# Patient Record
Sex: Female | Born: 1965 | Race: Black or African American | Hispanic: No | Smoking: Never smoker
Health system: Southern US, Community
[De-identification: ages and names within clinical notes are randomized; demographics above are authoritative.]

## PROBLEM LIST (undated history)

## (undated) DIAGNOSIS — Z87440 Personal history of urinary (tract) infections: Secondary | ICD-10-CM

## (undated) DIAGNOSIS — N3289 Other specified disorders of bladder: Secondary | ICD-10-CM

## (undated) DIAGNOSIS — B019 Varicella without complication: Secondary | ICD-10-CM

## (undated) DIAGNOSIS — D509 Iron deficiency anemia, unspecified: Secondary | ICD-10-CM

## (undated) HISTORY — DX: Personal history of urinary (tract) infections: Z87.440

## (undated) HISTORY — DX: Iron deficiency anemia, unspecified: D50.9

## (undated) HISTORY — DX: Varicella without complication: B01.9

---

## 1997-11-28 ENCOUNTER — Emergency Department (HOSPITAL_COMMUNITY): Admission: EM | Admit: 1997-11-28 | Discharge: 1997-11-28 | Payer: Self-pay | Admitting: Emergency Medicine

## 1999-02-14 ENCOUNTER — Other Ambulatory Visit: Admission: RE | Admit: 1999-02-14 | Discharge: 1999-02-14 | Payer: Self-pay | Admitting: Obstetrics and Gynecology

## 1999-06-19 ENCOUNTER — Inpatient Hospital Stay (HOSPITAL_COMMUNITY): Admission: AD | Admit: 1999-06-19 | Discharge: 1999-06-25 | Payer: Self-pay | Admitting: Obstetrics and Gynecology

## 1999-06-20 ENCOUNTER — Encounter: Payer: Self-pay | Admitting: Obstetrics and Gynecology

## 1999-06-22 ENCOUNTER — Encounter: Payer: Self-pay | Admitting: Obstetrics and Gynecology

## 1999-06-22 ENCOUNTER — Encounter: Payer: Self-pay | Admitting: *Deleted

## 1999-07-06 ENCOUNTER — Inpatient Hospital Stay (HOSPITAL_COMMUNITY): Admission: AD | Admit: 1999-07-06 | Discharge: 1999-07-08 | Payer: Self-pay | Admitting: Obstetrics and Gynecology

## 1999-07-10 ENCOUNTER — Encounter (HOSPITAL_COMMUNITY): Admission: RE | Admit: 1999-07-10 | Discharge: 1999-07-25 | Payer: Self-pay | Admitting: Obstetrics and Gynecology

## 1999-07-17 ENCOUNTER — Encounter: Payer: Self-pay | Admitting: Obstetrics and Gynecology

## 1999-07-24 ENCOUNTER — Inpatient Hospital Stay (HOSPITAL_COMMUNITY): Admission: AD | Admit: 1999-07-24 | Discharge: 1999-07-27 | Payer: Self-pay | Admitting: Obstetrics and Gynecology

## 1999-07-28 ENCOUNTER — Encounter: Admission: RE | Admit: 1999-07-28 | Discharge: 1999-10-26 | Payer: Self-pay | Admitting: Obstetrics and Gynecology

## 1999-08-30 ENCOUNTER — Other Ambulatory Visit: Admission: RE | Admit: 1999-08-30 | Discharge: 1999-08-30 | Payer: Self-pay | Admitting: Physical Therapy

## 1999-11-25 ENCOUNTER — Encounter: Admission: RE | Admit: 1999-11-25 | Discharge: 1999-12-08 | Payer: Self-pay | Admitting: Obstetrics and Gynecology

## 2001-08-15 ENCOUNTER — Other Ambulatory Visit: Admission: RE | Admit: 2001-08-15 | Discharge: 2001-08-15 | Payer: Self-pay | Admitting: Obstetrics and Gynecology

## 2003-02-15 ENCOUNTER — Other Ambulatory Visit: Admission: RE | Admit: 2003-02-15 | Discharge: 2003-02-15 | Payer: Self-pay | Admitting: Obstetrics and Gynecology

## 2004-07-07 ENCOUNTER — Other Ambulatory Visit: Admission: RE | Admit: 2004-07-07 | Discharge: 2004-07-07 | Payer: Self-pay | Admitting: Obstetrics and Gynecology

## 2005-10-09 ENCOUNTER — Other Ambulatory Visit: Admission: RE | Admit: 2005-10-09 | Discharge: 2005-10-09 | Payer: Self-pay | Admitting: Obstetrics and Gynecology

## 2014-09-11 ENCOUNTER — Emergency Department (HOSPITAL_COMMUNITY)
Admission: EM | Admit: 2014-09-11 | Discharge: 2014-09-11 | Disposition: A | Discharge status: Home / Self Care | Payer: 59 | Source: Home / Self Care | Attending: Family Medicine | Admitting: Family Medicine

## 2014-09-11 ENCOUNTER — Encounter (HOSPITAL_COMMUNITY): Payer: Self-pay | Admitting: *Deleted

## 2014-09-11 DIAGNOSIS — N3281 Overactive bladder: Secondary | ICD-10-CM | POA: Diagnosis not present

## 2014-09-11 LAB — POCT URINALYSIS DIP (DEVICE)
Bilirubin Urine: NEGATIVE
Glucose, UA: NEGATIVE mg/dL
Hgb urine dipstick: NEGATIVE
Ketones, ur: NEGATIVE mg/dL
Leukocytes, UA: NEGATIVE
Nitrite: NEGATIVE
Protein, ur: NEGATIVE mg/dL
Specific Gravity, Urine: 1.025 (ref 1.005–1.030)
Urobilinogen, UA: 0.2 mg/dL (ref 0.0–1.0)
pH: 5.5 (ref 5.0–8.0)

## 2014-09-11 MED ORDER — SOLIFENACIN SUCCINATE 10 MG PO TABS: 10.0000 mg | ORAL_TABLET | Freq: Every day | ORAL | Status: DC

## 2014-09-11 NOTE — ED Provider Notes (Signed)
CSN: [id number]     Arrival date & time 09/11/14  1113 History   First MD Initiated Contact with Patient 09/11/14 1156     Chief Complaint  Patient presents with  . Urinary Tract Infection   (Consider location/radiation/quality/duration/timing/severity/associated sxs/prior Treatment) Patient is a 49 y.o. female presenting with urinary tract infection. The history is provided by the patient.  Urinary Tract Infection This is a new problem. The current episode started more than 2 days ago. The problem has not changed since onset.Pertinent negatives include no abdominal pain and no shortness of breath. Associated symptoms comments: Mod caffeine use, perimenopausal..    History reviewed. No pertinent past medical history. History reviewed. No pertinent past surgical history. History reviewed. No pertinent family history. History  Substance Use Topics  . Smoking status: Never Smoker   . Smokeless tobacco: Not on file  . Alcohol Use: Yes   OB History    No data available     Review of Systems  Constitutional: Negative.  Negative for fever and chills.  Respiratory: Negative for shortness of breath.   Gastrointestinal: Negative.  Negative for abdominal pain.  Genitourinary: Positive for urgency and frequency. Negative for dysuria, hematuria, flank pain, vaginal bleeding, difficulty urinating, menstrual problem and pelvic pain.    Allergies  Review of patient's allergies indicates no known allergies.  Home Medications   Prior to Admission medications   Medication Sig Start Date End Date Taking? Authorizing Provider  solifenacin (VESICARE) 10 MG tablet Take 1 tablet (10 mg total) by mouth daily. 09/11/14   Linna HoffJames D Velta Rockholt, MD   BP 130/98 mmHg  Pulse 76  Temp(Src) 99 F (37.2 C) (Oral)  Resp 12  SpO2 100%  LMP 08/19/2014 Physical Exam  Constitutional: She is oriented to person, place, and time. She appears well-developed and well-nourished. No distress.  Abdominal: Soft. Bowel  sounds are normal. She exhibits no mass. There is no tenderness.  Neurological: She is alert and oriented to person, place, and time.  Skin: Skin is warm and dry.  Nursing note and vitals reviewed.   ED Course  Procedures (including critical care time) Labs Review Labs Reviewed  POCT URINALYSIS DIP (DEVICE)    Imaging Review No results found.   MDM   1. OAB (overactive bladder)        Linna HoffJames D Manish Ruggiero, MD 09/11/14 1219

## 2014-09-11 NOTE — ED Notes (Signed)
Pt  Reports  Symptoms  Of urinary  Frequency       Low  abd  Pressure  Pain     With  Onset  X  4-5  Days             Sitting  Upright on the  Exam table  Speaking in  Complete  sentances       Walking  Upright with a  Steady  Fluid  Gait

## 2015-03-23 ENCOUNTER — Emergency Department (HOSPITAL_COMMUNITY)
Admission: EM | Admit: 2015-03-23 | Discharge: 2015-03-23 | Disposition: A | Discharge status: Home / Self Care | Payer: 59 | Source: Home / Self Care | Attending: Family Medicine | Admitting: Family Medicine

## 2015-03-23 ENCOUNTER — Encounter (HOSPITAL_COMMUNITY): Payer: Self-pay | Admitting: Family Medicine

## 2015-03-23 DIAGNOSIS — J069 Acute upper respiratory infection, unspecified: Secondary | ICD-10-CM | POA: Diagnosis not present

## 2015-03-23 DIAGNOSIS — B9789 Other viral agents as the cause of diseases classified elsewhere: Principal | ICD-10-CM

## 2015-03-23 HISTORY — DX: Other specified disorders of bladder: N32.89

## 2015-03-23 MED ORDER — IPRATROPIUM BROMIDE 0.06 % NA SOLN: 2.0000 | Freq: Four times a day (QID) | NASAL | Status: DC

## 2015-03-23 MED ORDER — GUAIFENESIN-CODEINE 100-10 MG/5ML PO SOLN: 5.0000 mL | Freq: Four times a day (QID) | ORAL | Status: DC | PRN

## 2015-03-23 NOTE — ED Provider Notes (Signed)
CSN: [id number]     Arrival date & time 03/23/15  1338 History   First MD Initiated Contact with Patient 03/23/15 1600     Chief Complaint  Patient presents with  . URI   (Consider location/radiation/quality/duration/timing/severity/associated sxs/prior Treatment) HPI   5 days of cough, runny nose, and chest congestion. theraflu w/ minimal improvement. Worse at night.  No fevers, CP, nausea, diarrhea, productive cough, wheezing, abdominal pain, except, headache, dysuria, frequency, sore throat Son w/ similar symptoms prior to pts symptoms.    Past Medical History  Diagnosis Date  . Bladder spasm    History reviewed. No pertinent past surgical history. Family History  Problem Relation Age of Onset  . Cancer Other   . Diabetes Other    Social History  Substance Use Topics  . Smoking status: Never Smoker   . Smokeless tobacco: None  . Alcohol Use: Yes   OB History    No data available     Review of Systems Per HPI with all other pertinent systems negative.   Allergies  Review of patient's allergies indicates no known allergies.  Home Medications   Prior to Admission medications   Medication Sig Start Date End Date Taking? Authorizing Provider  guaiFENesin-codeine 100-10 MG/5ML syrup Take 5-10 mLs by mouth every 6 (six) hours as needed for cough. 03/23/15   Ozella Rocks, MD  ipratropium (ATROVENT) 0.06 % nasal spray Place 2 sprays into both nostrils 4 (four) times daily. 03/23/15   Ozella Rocks, MD   Meds Ordered and Administered this Visit  Medications - No data to display  BP 150/87 mmHg  Pulse 70  Temp(Src) 99.3 F (37.4 C) (Oral)  Resp 16  SpO2 100% No data found.   Physical Exam Physical Exam  Constitutional: oriented to person, place, and time. appears well-developed and well-nourished. No distress.  HENT:  No cervical lymphadenopathy, normal maxillary sinus tenderness to palpation, TMs normal bilaterally Head: Normocephalic and atraumatic.   Eyes: EOMI. PERRL.  Neck: Normal range of motion.  Cardiovascular: RRR, no m/r/g, 2+ distal pulses,  Pulmonary/Chest: Effort normal and breath sounds normal. No respiratory distress.  Abdominal: Soft. Bowel sounds are normal. NonTTP, no distension.  Musculoskeletal: Normal range of motion. Non ttp, no effusion.  Neurological: alert and oriented to person, place, and time.  Skin: Skin is warm. No rash noted. non diaphoretic.  Psychiatric: normal mood and affect. behavior is normal. Judgment and thought content normal.    ED Course  Procedures (including critical care time)  Labs Review Labs Reviewed - No data to display  Imaging Review No results found.   Visual Acuity Review  Right Eye Distance:   Left Eye Distance:   Bilateral Distance:    Right Eye Near:   Left Eye Near:    Bilateral Near:         MDM   1. Viral URI with cough    Common cold. Nasal Atrovent, Robitussin-AC, fluids, rest, NSAIDs, intranasal steroid.  Ozella Rocks, MD 03/23/15 [phone]

## 2015-03-23 NOTE — ED Notes (Signed)
Pt  Reports  Symptoms  Of  Cough   Congested  X  5 days

## 2015-03-23 NOTE — Discharge Instructions (Signed)
You likely have a viral cold. This should start getting better in the next 1-3 days. Please start using the nasal Atrovent during the day to help with your runny nose and postnasal drip. Please use the Robitussin-AC at night for cough. Please also consider using Flonase or Rhinocort at night to help with your sinus congestion. Please also consider starting Motrin/ibuprofen 600 mg every 6 hours for additional pain and inflammation. Please let us know if you develop fevers or worsening facial pain as this may be a sign that you're developing sinusitis.

## 2015-11-17 LAB — HM COLONOSCOPY

## 2016-01-10 ENCOUNTER — Other Ambulatory Visit: Payer: Self-pay | Admitting: Obstetrics and Gynecology

## 2016-01-10 DIAGNOSIS — R928 Other abnormal and inconclusive findings on diagnostic imaging of breast: Secondary | ICD-10-CM

## 2016-01-13 ENCOUNTER — Ambulatory Visit
Admission: RE | Admit: 2016-01-13 | Discharge: 2016-01-13 | Disposition: A | Discharge status: Home / Self Care | Payer: 59 | Source: Ambulatory Visit | Attending: Obstetrics and Gynecology | Admitting: Obstetrics and Gynecology

## 2016-01-13 ENCOUNTER — Other Ambulatory Visit: Payer: Self-pay | Admitting: Obstetrics and Gynecology

## 2016-01-13 DIAGNOSIS — R928 Other abnormal and inconclusive findings on diagnostic imaging of breast: Secondary | ICD-10-CM

## 2016-02-01 LAB — HM PAP SMEAR: HM Pap smear: NORMAL

## 2016-07-30 ENCOUNTER — Other Ambulatory Visit: Payer: Self-pay | Admitting: Obstetrics and Gynecology

## 2016-07-30 DIAGNOSIS — N6310 Unspecified lump in the right breast, unspecified quadrant: Secondary | ICD-10-CM

## 2016-08-08 ENCOUNTER — Ambulatory Visit
Admission: RE | Admit: 2016-08-08 | Discharge: 2016-08-08 | Disposition: A | Discharge status: Home / Self Care | Payer: 59 | Source: Ambulatory Visit | Attending: Obstetrics and Gynecology | Admitting: Obstetrics and Gynecology

## 2016-08-08 DIAGNOSIS — N6310 Unspecified lump in the right breast, unspecified quadrant: Secondary | ICD-10-CM

## 2016-08-08 DIAGNOSIS — N6313 Unspecified lump in the right breast, lower outer quadrant: Secondary | ICD-10-CM | POA: Diagnosis not present

## 2016-12-03 ENCOUNTER — Ambulatory Visit (INDEPENDENT_AMBULATORY_CARE_PROVIDER_SITE_OTHER): Payer: 59 | Admitting: Primary Care

## 2016-12-03 ENCOUNTER — Encounter: Payer: Self-pay | Admitting: Primary Care

## 2016-12-03 VITALS — BP 154/86 | HR 74 | Temp 98.0°F | Ht 64.0 in | Wt 155.8 lb

## 2016-12-03 DIAGNOSIS — R030 Elevated blood-pressure reading, without diagnosis of hypertension: Secondary | ICD-10-CM | POA: Insufficient documentation

## 2016-12-03 DIAGNOSIS — Z862 Personal history of diseases of the blood and blood-forming organs and certain disorders involving the immune mechanism: Secondary | ICD-10-CM

## 2016-12-03 DIAGNOSIS — R5383 Other fatigue: Secondary | ICD-10-CM | POA: Diagnosis not present

## 2016-12-03 NOTE — Patient Instructions (Signed)
Complete lab work prior to leaving today. I will notify you of your results once received.   It was a pleasure to meet you today! Please don't hesitate to call me with any questions. Welcome to [facility]!   

## 2016-12-03 NOTE — Assessment & Plan Note (Addendum)
History of iron deficiency anemia. Also with elevated BP readings on numerous occasions. All of these scenarios could be contributing. Will check labs today to rule out metabolic cause. No signs of depression.

## 2016-12-03 NOTE — Assessment & Plan Note (Signed)
Above goal today, also several documented readings in Epic. Will have her start monitoring her BP at home, will call for readings in 2-3 weeks. She is asymptomatic.

## 2016-12-03 NOTE — Progress Notes (Signed)
   Subjective:    Patient ID: Angela Harmon, female    DOB: [birth year], 51 y.o.   MRN: [id number]  HPI  Angela Harmon is a 51 year old female who presents today to establish care and discuss the problems mentioned below. Will obtain old records.  1) Fatigue: History of iron deficiency anemia in the past. Symptoms of feeling sluggish, low energy. These symptoms have been present intermittent for years. She's not currently taking oral iron, but doesn't recall an improvement when taking the iron. She was never consistent in taking her iron. She denies family history hypothyroidism.   2) Elevated Blood Pressure Reading: BP in the office today of 160/90. Prior documented readings in Epic of 150/87 in October 2016, and 130/98 in March 2016. Repeat BP in the office today of 154/86. She's never been told she's had high blood pressure in the past. She does not check her BP at home. She denies dizziness, chest pain, visual changes.  Review of Systems  Constitutional: Positive for fatigue. Negative for fever.  Respiratory: Negative for shortness of breath.   Cardiovascular: Negative for palpitations.  Gastrointestinal: Negative for abdominal pain and nausea.  Endocrine: Positive for cold intolerance.  Genitourinary: Negative for menstrual problem.  Neurological: Negative for weakness.       Past Medical History:  Diagnosis Date  . Bladder spasm   . Chickenpox   . History of UTI   . Iron deficiency anemia      Social History   Social History  . Marital status: Divorced    Spouse name: N/A  . Number of children: N/A  . Years of education: N/A   Occupational History  . Not on file.   Social History Main Topics  . Smoking status: Never Smoker  . Smokeless tobacco: Never Used  . Alcohol use Yes  . Drug use: Unknown  . Sexual activity: Not on file   Other Topics Concern  . Not on file   Social History Narrative   Divorced.   2 children.   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   Enjoys reading, going to the movies, travel, riding bikes.    Past Surgical History:  Procedure Laterality Date  . CESAREAN SECTION  2001    Family History  Problem Relation Age of Onset  . Hypertension Father   . Alcohol abuse Father   . Lymphoma Maternal Grandmother   . Diabetes Maternal Grandfather   . Rectal cancer Maternal Aunt     No Known Allergies  No current outpatient prescriptions on file prior to visit.   No current facility-administered medications on file prior to visit.     BP (!) 154/86   Pulse 74   Temp 98 F (36.7 C) (Oral)   Ht 5\' 4"  (1.626 m)   Wt 155 lb 12.8 oz (70.7 kg)   LMP 11/29/2016   SpO2 99%   BMI 26.74 kg/m    Objective:   Physical Exam  Constitutional: She appears well-nourished.  Neck: Neck supple.  Cardiovascular: Normal rate and regular rhythm.   Pulmonary/Chest: Effort normal and breath sounds normal.  Skin: Skin is warm and dry.  Psychiatric: She has a normal mood and affect.          Assessment & Plan:

## 2016-12-04 LAB — COMPREHENSIVE METABOLIC PANEL
ALBUMIN: 3.9 g/dL (ref 3.5–5.2)
ALK PHOS: 74 U/L (ref 39–117)
ALT: 11 U/L (ref 0–35)
AST: 15 U/L (ref 0–37)
BILIRUBIN TOTAL: 0.4 mg/dL (ref 0.2–1.2)
BUN: 9 mg/dL (ref 6–23)
CALCIUM: 9.1 mg/dL (ref 8.4–10.5)
CO2: 28 meq/L (ref 19–32)
CREATININE: 0.63 mg/dL (ref 0.40–1.20)
Chloride: 104 mEq/L (ref 96–112)
GFR: 126.99 mL/min (ref >60.00)
Glucose, Bld: 91 mg/dL (ref 70–99)
Potassium: 3.8 mEq/L (ref 3.5–5.1)
Sodium: 140 mEq/L (ref 135–145)
Total Protein: 6.8 g/dL (ref 6.0–8.3)

## 2016-12-04 LAB — VITAMIN D 25 HYDROXY (VIT D DEFICIENCY, FRACTURES): VITD: 14.89 ng/mL — ABNORMAL LOW (ref 30.00–100.00)

## 2016-12-04 LAB — IBC PANEL
IRON: 13 ug/dL — AB (ref 42–145)
SATURATION RATIOS: 3 % — AB (ref 20.0–50.0)
TRANSFERRIN: 309 mg/dL (ref 212.0–360.0)

## 2016-12-04 LAB — CBC
HCT: 27.5 % — ABNORMAL LOW (ref 36.0–46.0)
Hemoglobin: 9.1 g/dL — ABNORMAL LOW (ref 12.0–15.0)
MCHC: 33.1 g/dL (ref 30.0–36.0)
MCV: 68.9 fl — ABNORMAL LOW (ref 78.0–100.0)
Platelets: 493 10*3/uL — ABNORMAL HIGH (ref 150.0–400.0)
RBC: 3.99 Mil/uL (ref 3.87–5.11)
RDW: 20.1 % — ABNORMAL HIGH (ref 11.5–15.5)
WBC: 8.1 10*3/uL (ref 4.0–10.5)

## 2016-12-04 LAB — VITAMIN B12: VITAMIN B 12: 533 pg/mL (ref 211–911)

## 2016-12-04 LAB — TSH: TSH: 1.61 u[IU]/mL (ref 0.35–4.50)

## 2016-12-24 ENCOUNTER — Telehealth: Payer: Self-pay | Admitting: Primary Care

## 2016-12-24 NOTE — Telephone Encounter (Signed)
-----   Message from Doreene NestKatherine K Cliffie Gingras, NP sent at 12/03/2016  4:01 PM EDT ----- Regarding: BP Please check on patient's home BP readings. Her BP was elevated in the clinic during her new patient visit, also on prior Epic visits.

## 2016-12-24 NOTE — Telephone Encounter (Signed)
Message left for patient to return my call.  

## 2016-12-25 NOTE — Telephone Encounter (Signed)
Spoken to patient and she stated that her blood pressure is much better now. The last time she checked it was Sunday and it was 125/73.

## 2016-12-25 NOTE — Telephone Encounter (Signed)
Noted  

## 2017-01-14 ENCOUNTER — Other Ambulatory Visit: Payer: 59

## 2017-01-29 ENCOUNTER — Other Ambulatory Visit: Payer: Self-pay | Admitting: Obstetrics and Gynecology

## 2017-01-29 DIAGNOSIS — N63 Unspecified lump in unspecified breast: Secondary | ICD-10-CM

## 2017-02-11 ENCOUNTER — Ambulatory Visit
Admission: RE | Admit: 2017-02-11 | Discharge: 2017-02-11 | Disposition: A | Discharge status: Home / Self Care | Payer: 59 | Source: Ambulatory Visit | Attending: Obstetrics and Gynecology | Admitting: Obstetrics and Gynecology

## 2017-02-11 DIAGNOSIS — N6310 Unspecified lump in the right breast, unspecified quadrant: Secondary | ICD-10-CM | POA: Diagnosis not present

## 2017-02-11 DIAGNOSIS — R922 Inconclusive mammogram: Secondary | ICD-10-CM | POA: Diagnosis not present

## 2017-02-11 DIAGNOSIS — N63 Unspecified lump in unspecified breast: Secondary | ICD-10-CM

## 2017-04-11 DIAGNOSIS — Z01419 Encounter for gynecological examination (general) (routine) without abnormal findings: Secondary | ICD-10-CM | POA: Diagnosis not present

## 2018-01-08 IMAGING — MG 2D DIGITAL DIAGNOSTIC UNILATERAL RIGHT MAMMOGRAM WITH CAD AND AD
6 series · 6 of 14 positions shown · non-contrast
Comparison: Previous exam(s).

CLINICAL DATA: Six-month follow-up of a right breast mass.

EXAM:
2D DIGITAL DIAGNOSTIC RIGHT MAMMOGRAM WITH CAD AND ADJUNCT TOMO
ULTRASOUND RIGHT BREAST

[R MLO]
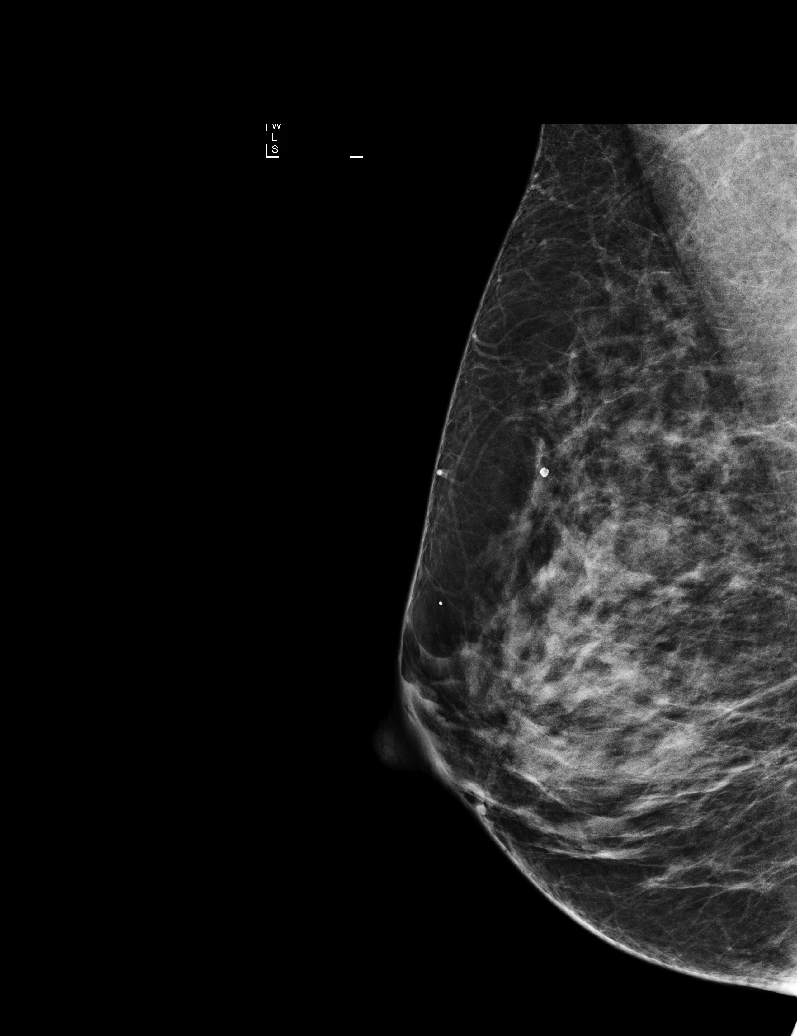

[R CC synth-2D]
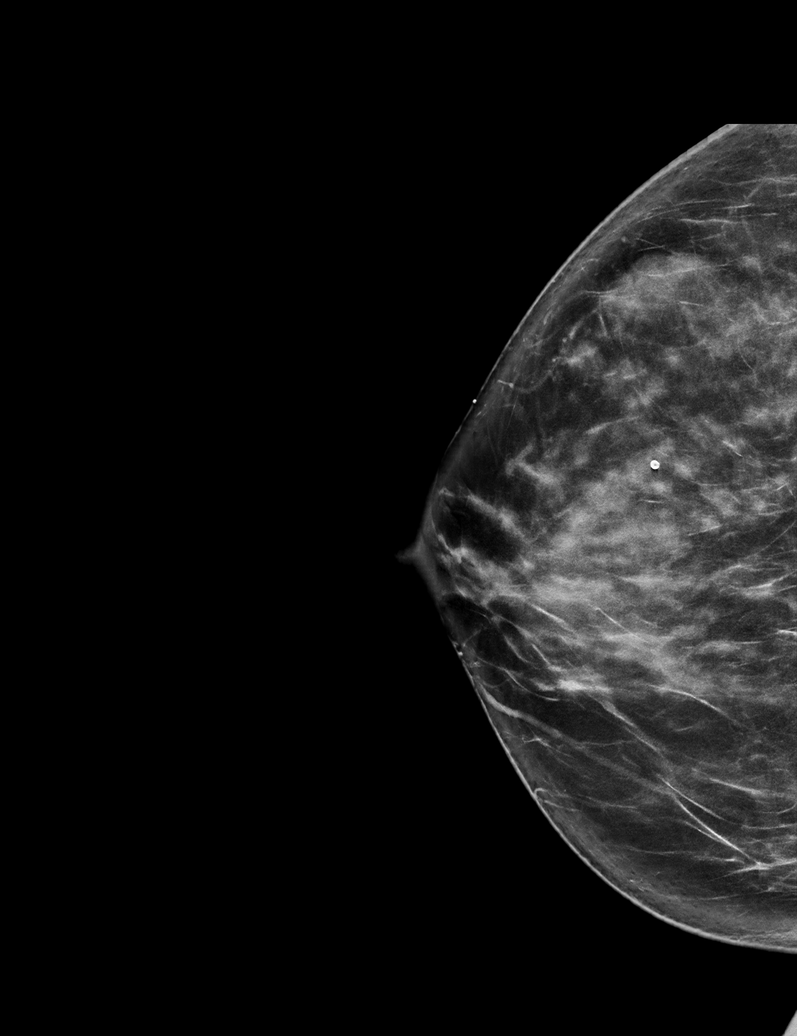

[R MLO synth-2D]
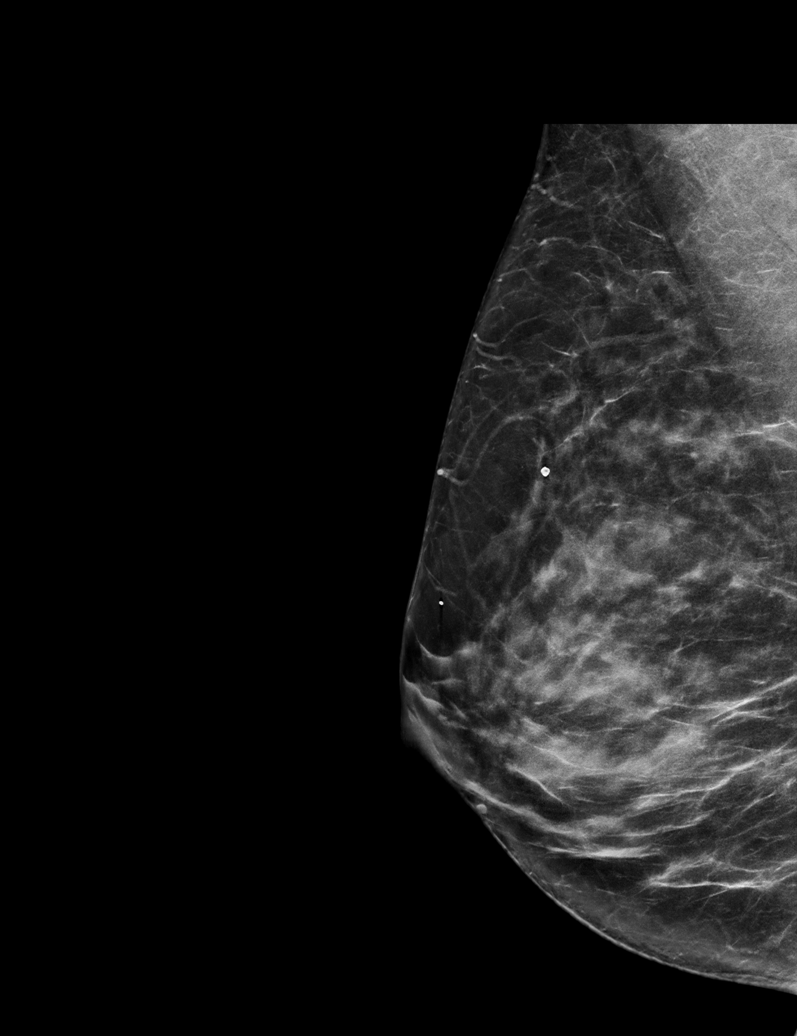

[R CC]
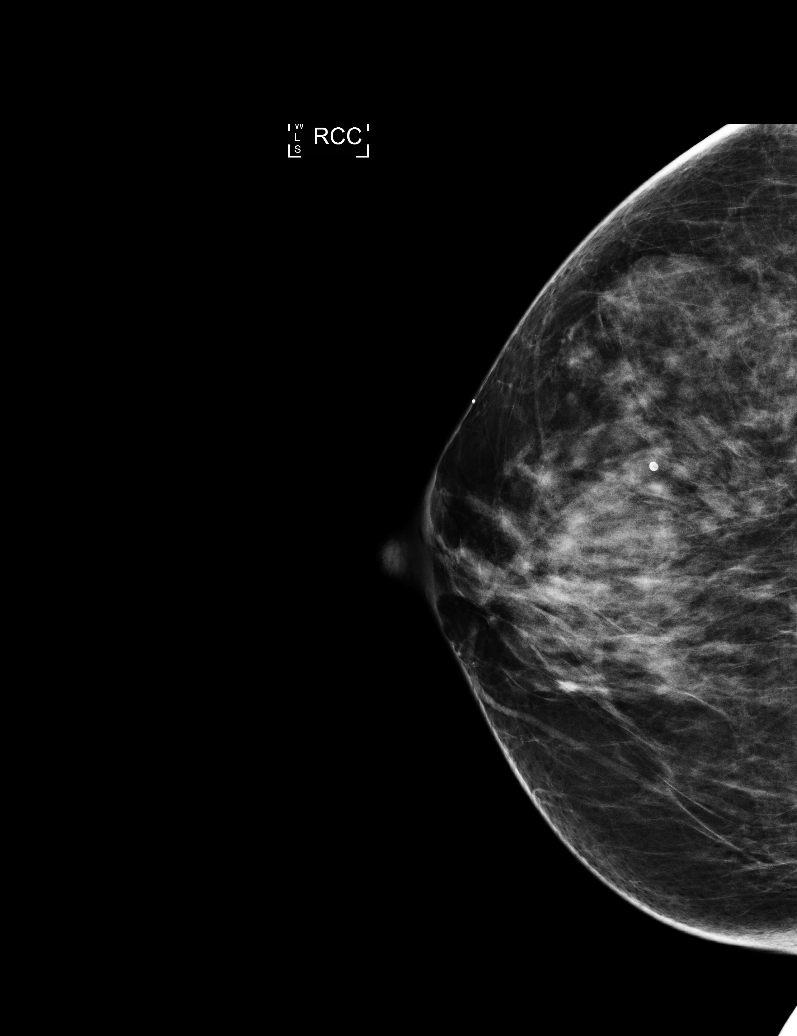

[R MLO tomo · tomo slice 36/71.0]
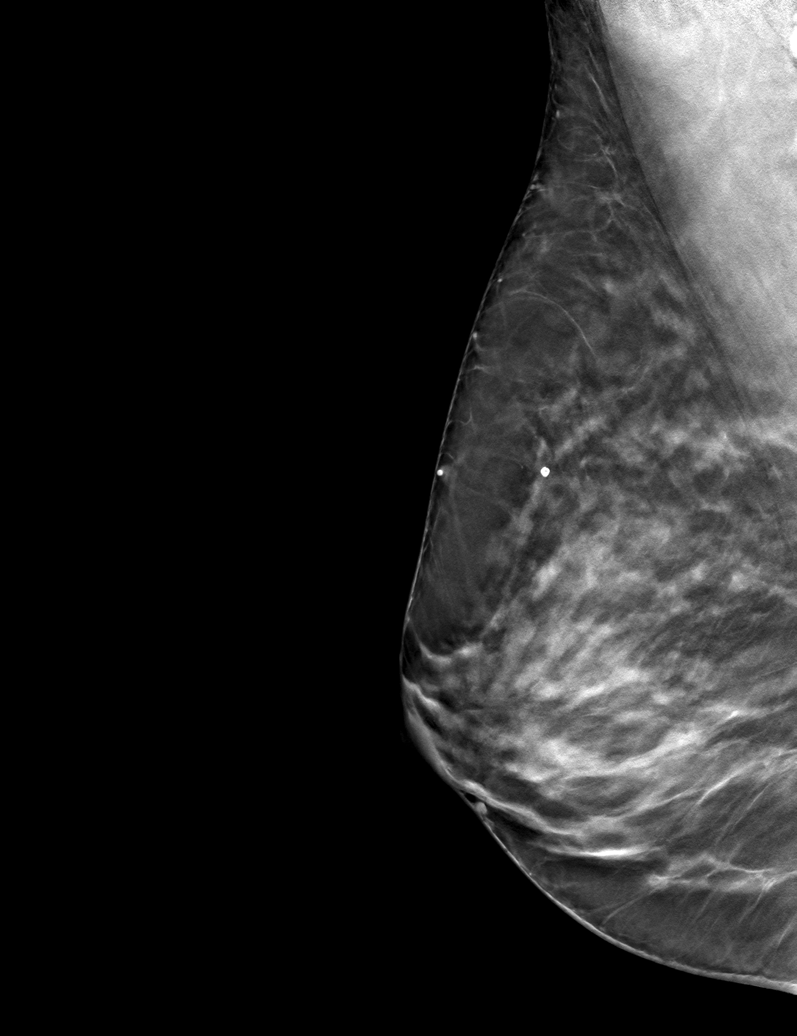

[R CC tomo · tomo slice 36/71.0]
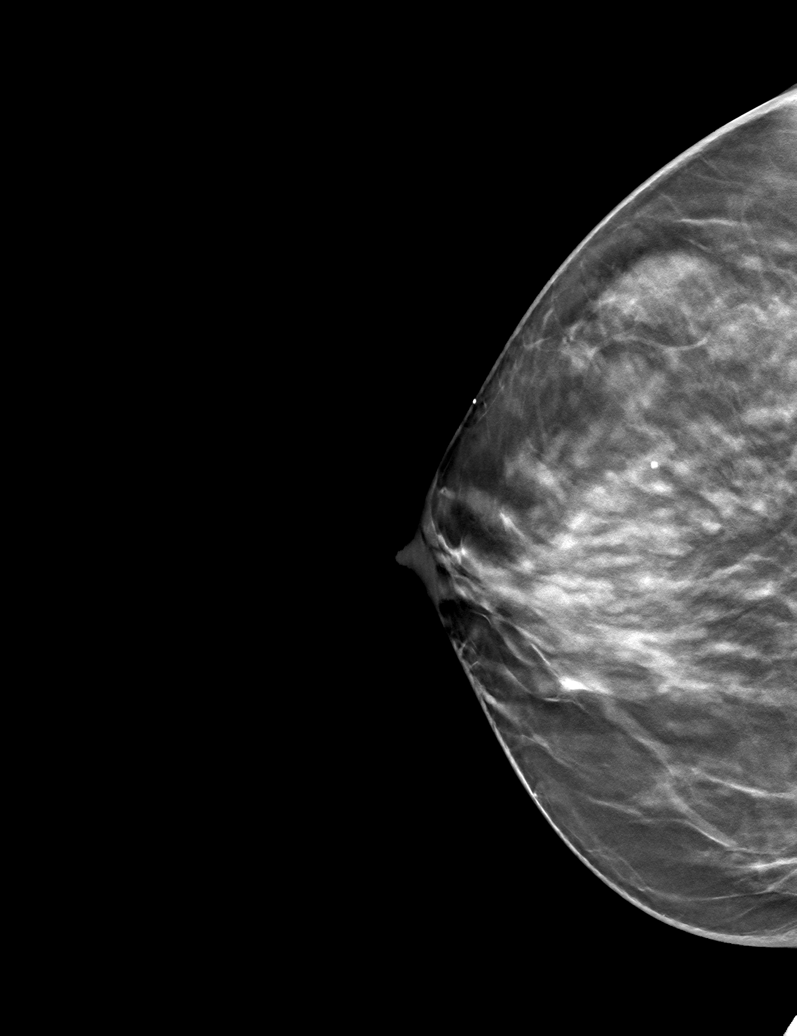

[6 of 14 positions shown; findings below may reference images not displayed]

ACR Breast Density Category c: The breast tissue is heterogeneously
dense, which may obscure small masses.
FINDINGS: The mass in the lateral central right breast is stable.

Mammographic images were processed with CAD.

On physical exam, no suspicious lumps.

Targeted ultrasound is performed, showing a heterogeneous mass in
the right breast at [DATE], 4 cm from the nipple measuring 2 by 2.4 x
1.4 cm today versus 1.9 by 2.4 by 1.4 cm previously.
IMPRESSION: Stable probably benign right breast mass

RECOMMENDATION:
Six-month follow-up mammogram and ultrasound of the right breast
mass to ensure stability. The patient will be due for bilateral
mammography at that time.

I have discussed the findings and recommendations with the patient.
Results were also provided in writing at the conclusion of the
visit. If applicable, a reminder letter will be sent to the patient
regarding the next appointment.

BI-RADS CATEGORY  3: Probably benign.

## 2018-05-29 DIAGNOSIS — Z01419 Encounter for gynecological examination (general) (routine) without abnormal findings: Secondary | ICD-10-CM | POA: Diagnosis not present

## 2018-05-29 DIAGNOSIS — Z6827 Body mass index (BMI) 27.0-27.9, adult: Secondary | ICD-10-CM | POA: Diagnosis not present

## 2018-12-25 ENCOUNTER — Other Ambulatory Visit: Payer: Self-pay | Admitting: Primary Care

## 2018-12-25 DIAGNOSIS — Z1159 Encounter for screening for other viral diseases: Secondary | ICD-10-CM

## 2018-12-25 DIAGNOSIS — Z0000 Encounter for general adult medical examination without abnormal findings: Secondary | ICD-10-CM

## 2018-12-25 DIAGNOSIS — E559 Vitamin D deficiency, unspecified: Secondary | ICD-10-CM

## 2018-12-31 ENCOUNTER — Other Ambulatory Visit (INDEPENDENT_AMBULATORY_CARE_PROVIDER_SITE_OTHER): Payer: 59

## 2018-12-31 DIAGNOSIS — Z0000 Encounter for general adult medical examination without abnormal findings: Secondary | ICD-10-CM | POA: Diagnosis not present

## 2018-12-31 DIAGNOSIS — E559 Vitamin D deficiency, unspecified: Secondary | ICD-10-CM | POA: Diagnosis not present

## 2018-12-31 DIAGNOSIS — Z1159 Encounter for screening for other viral diseases: Secondary | ICD-10-CM

## 2018-12-31 LAB — COMPREHENSIVE METABOLIC PANEL
ALT: 9 U/L (ref 0–35)
AST: 13 U/L (ref 0–37)
Albumin: 3.8 g/dL (ref 3.5–5.2)
Alkaline Phosphatase: 74 U/L (ref 39–117)
BUN: 9 mg/dL (ref 6–23)
CO2: 27 mEq/L (ref 19–32)
Calcium: 8.5 mg/dL (ref 8.4–10.5)
Chloride: 105 mEq/L (ref 96–112)
Creatinine, Ser: 0.58 mg/dL (ref 0.40–1.20)
GFR: 130.43 mL/min (ref >60.00)
Glucose, Bld: 92 mg/dL (ref 70–99)
Potassium: 4.1 mEq/L (ref 3.5–5.1)
Sodium: 138 mEq/L (ref 135–145)
Total Bilirubin: 0.4 mg/dL (ref 0.2–1.2)
Total Protein: 6.5 g/dL (ref 6.0–8.3)

## 2018-12-31 LAB — LIPID PANEL
Cholesterol: 178 mg/dL (ref 0–200)
HDL: 60.8 mg/dL (ref >39.00)
LDL Cholesterol: 104 mg/dL — ABNORMAL HIGH (ref 0–99)
NonHDL: 117.22
Total CHOL/HDL Ratio: 3
Triglycerides: 67 mg/dL (ref 0.0–149.0)
VLDL: 13.4 mg/dL (ref 0.0–40.0)

## 2018-12-31 LAB — HEMOGLOBIN A1C: Hgb A1c MFr Bld: 5 % (ref 4.6–6.5)

## 2018-12-31 LAB — VITAMIN D 25 HYDROXY (VIT D DEFICIENCY, FRACTURES): VITD: 23.62 ng/mL — ABNORMAL LOW (ref 30.00–100.00)

## 2019-01-01 LAB — HEPATITIS C ANTIBODY
Hepatitis C Ab: NONREACTIVE
SIGNAL TO CUT-OFF: 0.01 (ref <1.00)

## 2019-01-06 ENCOUNTER — Encounter: Payer: 59 | Admitting: Primary Care

## 2019-01-14 ENCOUNTER — Ambulatory Visit (INDEPENDENT_AMBULATORY_CARE_PROVIDER_SITE_OTHER): Payer: 59 | Admitting: Primary Care

## 2019-01-14 ENCOUNTER — Other Ambulatory Visit: Payer: Self-pay

## 2019-01-14 ENCOUNTER — Encounter: Payer: Self-pay | Admitting: Primary Care

## 2019-01-14 VITALS — BP 126/84 | HR 72 | Temp 98.0°F | Ht 64.0 in | Wt 166.0 lb

## 2019-01-14 DIAGNOSIS — D509 Iron deficiency anemia, unspecified: Secondary | ICD-10-CM

## 2019-01-14 DIAGNOSIS — R030 Elevated blood-pressure reading, without diagnosis of hypertension: Secondary | ICD-10-CM | POA: Diagnosis not present

## 2019-01-14 DIAGNOSIS — Z23 Encounter for immunization: Secondary | ICD-10-CM | POA: Diagnosis not present

## 2019-01-14 DIAGNOSIS — Z0000 Encounter for general adult medical examination without abnormal findings: Secondary | ICD-10-CM

## 2019-01-14 DIAGNOSIS — E559 Vitamin D deficiency, unspecified: Secondary | ICD-10-CM

## 2019-01-14 DIAGNOSIS — R5383 Other fatigue: Secondary | ICD-10-CM

## 2019-01-14 HISTORY — DX: Iron deficiency anemia, unspecified: D50.9

## 2019-01-14 LAB — CBC
HCT: 36.8 % (ref 36.0–46.0)
Hemoglobin: 11.8 g/dL — ABNORMAL LOW (ref 12.0–15.0)
MCHC: 32.2 g/dL (ref 30.0–36.0)
MCV: 80.8 fl (ref 78.0–100.0)
Platelets: 386 10*3/uL (ref 150.0–400.0)
RBC: 4.55 Mil/uL (ref 3.87–5.11)
RDW: 18 % — ABNORMAL HIGH (ref 11.5–15.5)
WBC: 6.2 10*3/uL (ref 4.0–10.5)

## 2019-01-14 LAB — IBC + FERRITIN
Ferritin: 5.3 ng/mL — ABNORMAL LOW (ref 10.0–291.0)
Iron: 59 ug/dL (ref 42–145)
Saturation Ratios: 14.2 % — ABNORMAL LOW (ref 20.0–50.0)
Transferrin: 297 mg/dL (ref 212.0–360.0)

## 2019-01-14 NOTE — Progress Notes (Signed)
Subjective:    Patient ID: Angela Harmon, female    DOB: 03/23/1966, 53 y.o.   MRN: [id number]  HPI  Angela Harmon is a 53 year old female who presents today for complete physical.  Immunizations: -Tetanus: Unsure -Influenza: Due this season   Diet: She endorses a fair diet. She is eating smaller portions, skips meals. Eating take out for lunch most days of the week, home cooked meals. Drinking water, lemonade, sweet tea. Exercise: She is walking some  Eye exam: Completed in 2019 Dental exam: Completes semi-annually  Colonoscopy: Completed in 2017, due in 2027 Pap Smear: Completed in 2019 Mammogram: Completed in 2019 Hep C Screen: Negative in 2020  BP Readings from Last 3 Encounters:  01/14/19 126/84  12/03/16 (!) 154/86  03/23/15 150/87     Review of Systems  Constitutional: Negative for unexpected weight change.  HENT: Negative for rhinorrhea.   Respiratory: Negative for cough and shortness of breath.   Cardiovascular: Negative for chest pain.  Gastrointestinal: Negative for constipation and diarrhea.  Genitourinary: Negative for difficulty urinating.       Irregular menstrual cycles over the last several months  Musculoskeletal: Negative for arthralgias and myalgias.  Skin: Negative for rash.  Allergic/Immunologic: Negative for environmental allergies.  Neurological: Negative for dizziness, numbness and headaches.  Psychiatric/Behavioral: The patient is not nervous/anxious.        Past Medical History:  Diagnosis Date  . Bladder spasm   . Chickenpox   . History of UTI   . Iron deficiency anemia      Social History   Socioeconomic History  . Marital status: Divorced    Spouse name: Not on file  . Number of children: Not on file  . Years of education: Not on file  . Highest education level: Not on file  Occupational History  . Not on file  Social Needs  . Financial resource strain: Not on file  . Food insecurity    Worry: Not on file    Inability: Not  on file  . Transportation needs    Medical: Not on file    Non-medical: Not on file  Tobacco Use  . Smoking status: Never Smoker  . Smokeless tobacco: Never Used  Substance and Sexual Activity  . Alcohol use: Yes  . Drug use: Not on file  . Sexual activity: Not on file  Lifestyle  . Physical activity    Days per week: Not on file    Minutes per session: Not on file  . Stress: Not on file  Relationships  . Social Herbalist on phone: Not on file    Gets together: Not on file    Attends religious service: Not on file    Active member of club or organization: Not on file    Attends meetings of clubs or organizations: Not on file    Relationship status: Not on file  . Intimate partner violence    Fear of current or ex partner: Not on file    Emotionally abused: Not on file    Physically abused: Not on file    Forced sexual activity: Not on file  Other Topics Concern  . Not on file  Social History Narrative   Divorced.   2 children.   Works as a Librarian, academic as a Education officer, museum.   Enjoys reading, going to the movies, travel, riding bikes.    Past Surgical History:  Procedure Laterality Date  . CESAREAN SECTION  2001  Family History  Problem Relation Age of Onset  . Hypertension Father   . Alcohol abuse Father   . Lymphoma Maternal Grandmother   . Diabetes Maternal Grandfather   . Rectal cancer Maternal Aunt     No Known Allergies  No current outpatient medications on file prior to visit.   No current facility-administered medications on file prior to visit.     BP 126/84   Pulse 72   Temp 98 F (36.7 C) (Temporal)   Ht 5\' 4"  (1.626 m)   Wt 166 lb (75.3 kg)   LMP 11/27/2018 (Within Days)   SpO2 98%   BMI 28.49 kg/m    Objective:   Physical Exam  Constitutional: She is oriented to person, place, and time. She appears well-nourished.  HENT:  Mouth/Throat: No oropharyngeal exudate.  Eyes: Pupils are equal, round, and reactive to light. EOM  are normal.  Neck: Neck supple. No thyromegaly present.  Cardiovascular: Normal rate and regular rhythm.  Respiratory: Effort normal and breath sounds normal.  GI: Soft. Bowel sounds are normal. There is no abdominal tenderness.  Musculoskeletal: Normal range of motion.  Neurological: She is alert and oriented to person, place, and time.  Skin: Skin is warm and dry.  Psychiatric: She has a normal mood and affect.           Assessment & Plan:

## 2019-01-14 NOTE — Assessment & Plan Note (Signed)
Low on recent labs and not taking supplemental vitamin D, recommend she start 2000 units daily.

## 2019-01-14 NOTE — Assessment & Plan Note (Addendum)
Unsure for tetanus, provided today.  Pap smear UTD, follows with GYN. Colonoscopy UTD. Encouraged a healthy diet, regular exercise. Exam unremarkable. Labs reviewed. Follow up in 1 year for CPE

## 2019-01-14 NOTE — Addendum Note (Signed)
Addended by: Jacqualin Combes on: 01/14/2019 04:02 PM   Modules accepted: Orders

## 2019-01-14 NOTE — Assessment & Plan Note (Signed)
Normal in the office today, continue to monitor.  

## 2019-01-14 NOTE — Patient Instructions (Addendum)
Stop by the lab prior to leaving today. I will notify you of your results once received.   Be more consistent with oral vitamin D and iron.   Start exercising. You should be getting 150 minutes of moderate intensity exercise weekly.  It's important to improve your diet by reducing consumption of fast food, fried food, processed snack foods, sugary drinks. Increase consumption of fresh vegetables and fruits, whole grains, water.  Ensure you are drinking 64 ounces of water daily.  It was a pleasure to see you today!   Preventive Care 4-57 Years Old, Female Preventive care refers to visits with your health care provider and lifestyle choices that can promote health and wellness. This includes:  A yearly physical exam. This may also be called an annual well check.  Regular dental visits and eye exams.  Immunizations.  Screening for certain conditions.  Healthy lifestyle choices, such as eating a healthy diet, getting regular exercise, not using drugs or products that contain nicotine and tobacco, and limiting alcohol use. What can I expect for my preventive care visit? Physical exam Your health care provider will check your:  Height and weight. This may be used to calculate body mass index (BMI), which tells if you are at a healthy weight.  Heart rate and blood pressure.  Skin for abnormal spots. Counseling Your health care provider may ask you questions about your:  Alcohol, tobacco, and drug use.  Emotional well-being.  Home and relationship well-being.  Sexual activity.  Eating habits.  Work and work Statistician.  Method of birth control.  Menstrual cycle.  Pregnancy history. What immunizations do I need?  Influenza (flu) vaccine  This is recommended every year. Tetanus, diphtheria, and pertussis (Tdap) vaccine  You may need a Td booster every 10 years. Varicella (chickenpox) vaccine  You may need this if you have not been vaccinated. Zoster (shingles)  vaccine  You may need this after age 5. Measles, mumps, and rubella (MMR) vaccine  You may need at least one dose of MMR if you were born in 1957 or later. You may also need a second dose. Pneumococcal conjugate (PCV13) vaccine  You may need this if you have certain conditions and were not previously vaccinated. Pneumococcal polysaccharide (PPSV23) vaccine  You may need one or two doses if you smoke cigarettes or if you have certain conditions. Meningococcal conjugate (MenACWY) vaccine  You may need this if you have certain conditions. Hepatitis A vaccine  You may need this if you have certain conditions or if you travel or work in places where you may be exposed to hepatitis A. Hepatitis B vaccine  You may need this if you have certain conditions or if you travel or work in places where you may be exposed to hepatitis B. Haemophilus influenzae type b (Hib) vaccine  You may need this if you have certain conditions. Human papillomavirus (HPV) vaccine  If recommended by your health care provider, you may need three doses over 6 months. You may receive vaccines as individual doses or as more than one vaccine together in one shot (combination vaccines). Talk with your health care provider about the risks and benefits of combination vaccines. What tests do I need? Blood tests  Lipid and cholesterol levels. These may be checked every 5 years, or more frequently if you are over 15 years old.  Hepatitis C test.  Hepatitis B test. Screening  Lung cancer screening. You may have this screening every year starting at age 3 if you  have a 30-pack-year history of smoking and currently smoke or have quit within the past 15 years.  Colorectal cancer screening. All adults should have this screening starting at age 78 and continuing until age 41. Your health care provider may recommend screening at age 82 if you are at increased risk. You will have tests every 1-10 years, depending on your  results and the type of screening test.  Diabetes screening. This is done by checking your blood sugar (glucose) after you have not eaten for a while (fasting). You may have this done every 1-3 years.  Mammogram. This may be done every 1-2 years. Talk with your health care provider about when you should start having regular mammograms. This may depend on whether you have a family history of breast cancer.  BRCA-related cancer screening. This may be done if you have a family history of breast, ovarian, tubal, or peritoneal cancers.  Pelvic exam and Pap test. This may be done every 3 years starting at age 52. Starting at age 83, this may be done every 5 years if you have a Pap test in combination with an HPV test. Other tests  Sexually transmitted disease (STD) testing.  Bone density scan. This is done to screen for osteoporosis. You may have this scan if you are at high risk for osteoporosis. Follow these instructions at home: Eating and drinking  Eat a diet that includes fresh fruits and vegetables, whole grains, lean protein, and low-fat dairy.  Take vitamin and mineral supplements as recommended by your health care provider.  Do not drink alcohol if: ? Your health care provider tells you not to drink. ? You are pregnant, may be pregnant, or are planning to become pregnant.  If you drink alcohol: ? Limit how much you have to 0-1 drink a day. ? Be aware of how much alcohol is in your drink. In the [place] one drink equals one 12 oz bottle of beer (355 mL), one 5 oz glass of wine (148 mL), or one 1 oz glass of hard liquor (44 mL). Lifestyle  Take daily care of your teeth and gums.  Stay active. Exercise for at least 30 minutes on 5 or more days each week.  Do not use any products that contain nicotine or tobacco, such as cigarettes, e-cigarettes, and chewing tobacco. If you need help quitting, ask your health care provider.  If you are sexually active, practice safe sex. Use a  condom or other form of birth control (contraception) in order to prevent pregnancy and STIs (sexually transmitted infections).  If told by your health care provider, take low-dose aspirin daily starting at age 40. What's next?  Visit your health care provider once a year for a well check visit.  Ask your health care provider how often you should have your eyes and teeth checked.  Stay up to date on all vaccines. This information is not intended to replace advice given to you by your health care provider. Make sure you discuss any questions you have with your health care provider. Document Released: 07/01/2015 Document Revised: 02/13/2018 Document Reviewed: 02/13/2018 Elsevier Patient Education  2020 [employer].

## 2019-01-14 NOTE — Assessment & Plan Note (Addendum)
Chronic for years, GYN monitors annually, is taking oral iron 3-4 days weekly on average. Some fatigue. Repeat IBC panel and CBC pending.  Does not follow with hematology, has never received iron infusions.

## 2019-01-14 NOTE — Assessment & Plan Note (Signed)
Intermittent. Chronic iron deficiency anemia.  Discussed to remain compliant to oral iron, start with regular exercise.

## 2019-07-29 LAB — HM MAMMOGRAPHY

## 2019-08-02 LAB — HM PAP SMEAR

## 2020-06-27 ENCOUNTER — Telehealth: Payer: Self-pay

## 2020-06-27 NOTE — Telephone Encounter (Signed)
Noted and agree, will evaluate.

## 2020-06-27 NOTE — Telephone Encounter (Signed)
[facility] Day - Client TELEPHONE ADVICE RECORD [facility] Patient Name: Angela Harmon Gender: Female DOB: October 12, 1965 Age: 55 Y 9 M 30 D Return Phone Number: [phone] (Primary) Address: City/State/Zip: [place] [address] Client [facility] Day - Client Client Site [facility] - Day Physician Vernona Rieger - NP Contact Type Call Who Is Calling Patient / Member / Family / Caregiver Call Type Triage / Clinical Caller Name Alice Relationship To Patient Care Giver Return Phone Number [phone] (Primary) Chief Complaint Blood Pressure High Reason for Call Symptomatic / Request for Health Information Initial Comment Caller is calling for pt who is having elevated heart rate and blood pressure. PT Bp was 148/91 this weekend and unsure of current heart rate. Translation No Nurse Assessment Nurse: Annye English, RN, Denise Date/Time (Eastern Time): 06/27/2020 9:19:02 AM Confirm and document reason for call. If symptomatic, describe symptoms. ---Pt reports her HR is 76 and BP is 137/93. Does the patient have any new or worsening symptoms? ---Yes Will a triage be completed? ---Yes Related visit to physician within the last 2 weeks? ---No Does the PT have any chronic conditions? (i.e. diabetes, asthma, this includes High risk factors for pregnancy, etc.) ---No Is this a behavioral health or substance abuse call? ---No Guidelines Guideline Title Affirmed Question Affirmed Notes Nurse Date/Time (Eastern Time) Blood Pressure - High [1] Systolic BP >= 130 OR Diastolic >= 80 AND [2] not taking BP medications Carmon, RN, Denise 06/27/2020 9:20:58 AM Disp. Time Lamount Cohen Time) Disposition Final User 06/27/2020 9:23:09 AM See PCP within 2 Weeks Yes Carmon, RN, Leighton Ruff Disagree/Comply Comply Caller Understands Yes PLEASE NOTE: All timestamps contained within this report are represented as [place] Standard  Time. CONFIDENTIALTY NOTICE: This fax transmission is intended only for the addressee. It contains information that is legally privileged, confidential or otherwise protected from use or disclosure. If you are not the intended recipient, you are strictly prohibited from reviewing, disclosing, copying using or disseminating any of this information or taking any action in reliance on or regarding this information. If you have received this fax in error, please notify us immediately by telephone so that we can arrange for its return to [place]. Phone: [phone], Toll-Free: [phone], Fax: [phone] Page: 2 of 2 Call Id: [id number] PreDisposition Call Doctor Care Advice Given Per Guideline SEE PCP WITHIN 2 WEEKS: HIGH BLOOD PRESSURE - LIFESTYLE MODIFICATIONS: * DECREASE SODIUM INTAKE: Aim to eat less than 2.4 g (100 mmol) of sodium each day. Unfortunately 75% of the salt in the average person's diet is in preprocessed foods. * EAT HEALTHY: Eat a diet rich in fresh fruits and vegetables, dietary fiber, non-animal protein (e.g., soy), and low-fat dairy products. Avoid foods with a high content of saturated fat or cholesterol. * The following things can help you reduce your blood pressure. * EXERCISE, BE MORE PHYSICALLY ACTIVE: Do at least 30 minutes of aerobic exercise (e.g., brisk walking) most days of the week. Other examples of aerobic activities cycling, jogging, and swimming. CALL BACK IF: * Your blood pressure is over 160/100 * Chest pain or difficulty breathing occurs * You become worse Referrals REFERRED TO PCP OFFICE  Called and spoke with patient regarding the above issue. Patient denied SOB, Chest Pain, Lightheadedness, Dizziness, Numbness or Pain. Patient stated that she has started checking her blood pressure on Thursday because her heart rate was high. Patient stated that her highest blood pressure reading was 148/87 and HR was 93. Patient has  an appointment with Mayra Reel, NP on  Wednesday 06/29/2020. Informed patient to keep a log of her blood pressure and heart rate to show Chestine Spore, NP when she comes in for her appointment. Also, encouraged patient to drink plenty of fluids, exercise and reduce salt intake. Patient verbalized understanding.

## 2020-06-29 ENCOUNTER — Encounter: Payer: Self-pay | Admitting: Primary Care

## 2020-06-29 ENCOUNTER — Other Ambulatory Visit: Payer: Self-pay

## 2020-06-29 ENCOUNTER — Ambulatory Visit: Payer: 59 | Admitting: Primary Care

## 2020-06-29 VITALS — BP 140/80 | HR 94 | Temp 97.6°F | Ht 64.0 in | Wt 165.0 lb

## 2020-06-29 DIAGNOSIS — D509 Iron deficiency anemia, unspecified: Secondary | ICD-10-CM | POA: Diagnosis not present

## 2020-06-29 DIAGNOSIS — E559 Vitamin D deficiency, unspecified: Secondary | ICD-10-CM

## 2020-06-29 DIAGNOSIS — R030 Elevated blood-pressure reading, without diagnosis of hypertension: Secondary | ICD-10-CM | POA: Diagnosis not present

## 2020-06-29 DIAGNOSIS — Z23 Encounter for immunization: Secondary | ICD-10-CM | POA: Diagnosis not present

## 2020-06-29 LAB — COMPREHENSIVE METABOLIC PANEL
ALT: 18 U/L (ref 0–35)
AST: 16 U/L (ref 0–37)
Albumin: 4.6 g/dL (ref 3.5–5.2)
Alkaline Phosphatase: 96 U/L (ref 39–117)
BUN: 12 mg/dL (ref 6–23)
CO2: 30 mEq/L (ref 19–32)
Calcium: 9.9 mg/dL (ref 8.4–10.5)
Chloride: 102 mEq/L (ref 96–112)
Creatinine, Ser: 0.74 mg/dL (ref 0.40–1.20)
GFR: 90.18 mL/min (ref >60.00)
Glucose, Bld: 106 mg/dL — ABNORMAL HIGH (ref 70–99)
Potassium: 4.9 mEq/L (ref 3.5–5.1)
Sodium: 137 mEq/L (ref 135–145)
Total Bilirubin: 0.5 mg/dL (ref 0.2–1.2)
Total Protein: 7.6 g/dL (ref 6.0–8.3)

## 2020-06-29 LAB — CBC
HCT: 42.8 % (ref 36.0–46.0)
Hemoglobin: 14.7 g/dL (ref 12.0–15.0)
MCHC: 34.3 g/dL (ref 30.0–36.0)
MCV: 83 fl (ref 78.0–100.0)
Platelets: 346 10*3/uL (ref 150.0–400.0)
RBC: 5.16 Mil/uL — ABNORMAL HIGH (ref 3.87–5.11)
RDW: 13.3 % (ref 11.5–15.5)
WBC: 5.8 10*3/uL (ref 4.0–10.5)

## 2020-06-29 LAB — LIPID PANEL
Cholesterol: 174 mg/dL (ref 0–200)
HDL: 54.8 mg/dL (ref >39.00)
LDL Cholesterol: 103 mg/dL — ABNORMAL HIGH (ref 0–99)
NonHDL: 119.34
Total CHOL/HDL Ratio: 3
Triglycerides: 81 mg/dL (ref 0.0–149.0)
VLDL: 16.2 mg/dL (ref 0.0–40.0)

## 2020-06-29 LAB — TSH: TSH: 2.19 u[IU]/mL (ref 0.35–4.50)

## 2020-06-29 LAB — IBC + FERRITIN
Ferritin: 68.5 ng/mL (ref 10.0–291.0)
Iron: 60 ug/dL (ref 42–145)
Saturation Ratios: 18.2 % — ABNORMAL LOW (ref 20.0–50.0)
Transferrin: 236 mg/dL (ref 212.0–360.0)

## 2020-06-29 LAB — HEMOGLOBIN A1C: Hgb A1c MFr Bld: 5.5 % (ref 4.6–6.5)

## 2020-06-29 LAB — VITAMIN D 25 HYDROXY (VIT D DEFICIENCY, FRACTURES): VITD: 41.05 ng/mL (ref 30.00–100.00)

## 2020-06-29 NOTE — Assessment & Plan Note (Signed)
Noted again in the office, home readings are lower but borderline too high.  She is very motivated to work on weight loss through diet and exercise. Given this we will have her monitor BP at home, work on lifestyle changes, check labs today, and see her back in 2 months for re-evaluation.

## 2020-06-29 NOTE — Assessment & Plan Note (Signed)
Repeat CBC and IBC panels pending.

## 2020-06-29 NOTE — Patient Instructions (Signed)
Stop by the lab prior to leaving today. I will notify you of your results once received.   It's important to improve your diet by reducing consumption of fast food, fried food, processed snack foods, sugary drinks. Increase consumption of fresh vegetables and fruits, whole grains, water.  Ensure you are drinking 64 ounces of water daily.  Start monitoring your blood pressure daily or several times weekly, around the same time of day, for the next 2 months.  Ensure that you have rested for 30 minutes prior to checking your blood pressure. Record your readings and bring them to your next visit.  Reschedule your physical for mid March 2022.  It was a pleasure to see you today!   [email or web address].[email or web address]">  DASH Eating Plan DASH stands for Dietary Approaches to Stop Hypertension. The DASH eating plan is a healthy eating plan that has been shown to:  Reduce high blood pressure (hypertension).  Reduce your risk for type 2 diabetes, heart disease, and stroke.  Help with weight loss. What are tips for following this plan? Reading food labels  Check food labels for the amount of salt (sodium) per serving. Choose foods with less than 5 percent of the Daily Value of sodium. Generally, foods with less than 300 milligrams (mg) of sodium per serving fit into this eating plan.  To find whole grains, look for the word "whole" as the first word in the ingredient list. Shopping  Buy products labeled as "low-sodium" or "no salt added."  Buy fresh foods. Avoid canned foods and pre-made or frozen meals. Cooking  Avoid adding salt when cooking. Use salt-free seasonings or herbs instead of table salt or sea salt. Check with your health care provider or pharmacist before using salt substitutes.  Do not fry foods. Cook foods using healthy methods such as baking, boiling, grilling, roasting, and broiling instead.  Cook with heart-healthy oils, such as olive,  canola, avocado, soybean, or sunflower oil. Meal planning  Eat a balanced diet that includes: ? 4 or more servings of fruits and 4 or more servings of vegetables each day. Try to fill one-half of your plate with fruits and vegetables. ? 6-8 servings of whole grains each day. ? Less than 6 oz (170 g) of lean meat, poultry, or fish each day. A 3-oz (85-g) serving of meat is about the same size as a deck of cards. One egg equals 1 oz (28 g). ? 2-3 servings of low-fat dairy each day. One serving is 1 cup (237 mL). ? 1 serving of nuts, seeds, or beans 5 times each week. ? 2-3 servings of heart-healthy fats. Healthy fats called omega-3 fatty acids are found in foods such as walnuts, flaxseeds, fortified milks, and eggs. These fats are also found in cold-water fish, such as sardines, salmon, and mackerel.  Limit how much you eat of: ? Canned or prepackaged foods. ? Food that is high in trans fat, such as some fried foods. ? Food that is high in saturated fat, such as fatty meat. ? Desserts and other sweets, sugary drinks, and other foods with added sugar. ? Full-fat dairy products.  Do not salt foods before eating.  Do not eat more than 4 egg yolks a week.  Try to eat at least 2 vegetarian meals a week.  Eat more home-cooked food and less restaurant, buffet, and fast food.   Lifestyle  When eating at a restaurant, ask that your food be prepared with less salt or no salt, if possible.  If you  drink alcohol: ? Limit how much you use to:  0-1 drink a day for women who are not pregnant.  0-2 drinks a day for men. ? Be aware of how much alcohol is in your drink. In the [place] one drink equals one 12 oz bottle of beer (355 mL), one 5 oz glass of wine (148 mL), or one 1 oz glass of hard liquor (44 mL). General information  Avoid eating more than 2,300 mg of salt a day. If you have hypertension, you may need to reduce your sodium intake to 1,500 mg a day.  Work with your health care provider  to maintain a healthy body weight or to lose weight. Ask what an ideal weight is for you.  Get at least 30 minutes of exercise that causes your heart to beat faster (aerobic exercise) most days of the week. Activities may include walking, swimming, or biking.  Work with your health care provider or dietitian to adjust your eating plan to your individual calorie needs. What foods should I eat? Fruits All fresh, dried, or frozen fruit. Canned fruit in natural juice (without added sugar). Vegetables Fresh or frozen vegetables (raw, steamed, roasted, or grilled). Low-sodium or reduced-sodium tomato and vegetable juice. Low-sodium or reduced-sodium tomato sauce and tomato paste. Low-sodium or reduced-sodium canned vegetables. Grains Whole-grain or whole-wheat bread. Whole-grain or whole-wheat pasta. Brown rice. Orpah Cobb. Bulgur. Whole-grain and low-sodium cereals. Pita bread. Low-fat, low-sodium crackers. Whole-wheat flour tortillas. Meats and other proteins Skinless chicken or [place]. Ground chicken or [place]. Pork with fat trimmed off. Fish and seafood. Egg whites. Dried beans, peas, or lentils. Unsalted nuts, nut butters, and seeds. Unsalted canned beans. Lean cuts of beef with fat trimmed off. Low-sodium, lean precooked or cured meat, such as sausages or meat loaves. Dairy Low-fat (1%) or fat-free (skim) milk. Reduced-fat, low-fat, or fat-free cheeses. Nonfat, low-sodium ricotta or cottage cheese. Low-fat or nonfat yogurt. Low-fat, low-sodium cheese. Fats and oils Soft margarine without trans fats. Vegetable oil. Reduced-fat, low-fat, or light mayonnaise and salad dressings (reduced-sodium). Canola, safflower, olive, avocado, soybean, and sunflower oils. Avocado. Seasonings and condiments Herbs. Spices. Seasoning mixes without salt. Other foods Unsalted popcorn and pretzels. Fat-free sweets. The items listed above may not be a complete list of foods and beverages you can eat. Contact a  dietitian for more information. What foods should I avoid? Fruits Canned fruit in a light or heavy syrup. Fried fruit. Fruit in cream or butter sauce. Vegetables Creamed or fried vegetables. Vegetables in a cheese sauce. Regular canned vegetables (not low-sodium or reduced-sodium). Regular canned tomato sauce and paste (not low-sodium or reduced-sodium). Regular tomato and vegetable juice (not low-sodium or reduced-sodium). Rosita Fire. Olives. Grains Baked goods made with fat, such as croissants, muffins, or some breads. Dry pasta or rice meal packs. Meats and other proteins Fatty cuts of meat. Ribs. Fried meat. Tomasa Blase. Bologna, salami, and other precooked or cured meats, such as sausages or meat loaves. Fat from the back of a pig (fatback). Bratwurst. Salted nuts and seeds. Canned beans with added salt. Canned or smoked fish. Whole eggs or egg yolks. Chicken or [place] with skin. Dairy Whole or 2% milk, cream, and half-and-half. Whole or full-fat cream cheese. Whole-fat or sweetened yogurt. Full-fat cheese. Nondairy creamers. Whipped toppings. Processed cheese and cheese spreads. Fats and oils Butter. Stick margarine. Lard. Shortening. Ghee. Bacon fat. Tropical oils, such as coconut, palm kernel, or palm oil. Seasonings and condiments Onion salt, garlic salt, seasoned salt, table salt, and sea salt.  Worcestershire sauce. Tartar sauce. Barbecue sauce. Teriyaki sauce. Soy sauce, including reduced-sodium. Steak sauce. Canned and packaged gravies. Fish sauce. Oyster sauce. Cocktail sauce. Store-bought horseradish. Ketchup. Mustard. Meat flavorings and tenderizers. Bouillon cubes. Hot sauces. Pre-made or packaged marinades. Pre-made or packaged taco seasonings. Relishes. Regular salad dressings. Other foods Salted popcorn and pretzels. The items listed above may not be a complete list of foods and beverages you should avoid. Contact a dietitian for more information. Where to find more  information  National Heart, Lung, and Blood Institute: PopSteam.is  American Heart Association: www.heart.org  Academy of Nutrition and Dietetics: www.eatright.org  National Kidney Foundation: www.kidney.org Summary  The DASH eating plan is a healthy eating plan that has been shown to reduce high blood pressure (hypertension). It may also reduce your risk for type 2 diabetes, heart disease, and stroke.  When on the DASH eating plan, aim to eat more fresh fruits and vegetables, whole grains, lean proteins, low-fat dairy, and heart-healthy fats.  With the DASH eating plan, you should limit salt (sodium) intake to 2,300 mg a day. If you have hypertension, you may need to reduce your sodium intake to 1,500 mg a day.  Work with your health care provider or dietitian to adjust your eating plan to your individual calorie needs. This information is not intended to replace advice given to you by your health care provider. Make sure you discuss any questions you have with your health care provider. Document Revised: 05/08/2019 Document Reviewed: 05/08/2019 Elsevier Patient Education  2021 [employer].

## 2020-06-29 NOTE — Progress Notes (Signed)
Subjective:    Patient ID: Angela Harmon, female    DOB: [birth year], 55 y.o.   MRN: [id number]  HPI  This visit occurred during the SARS-CoV-2 public health emergency.  Safety protocols were in place, including screening questions prior to the visit, additional usage of staff PPE, and extensive cleaning of exam room while observing appropriate contact time as indicated for disinfecting solutions.   Angela Harmon is a 55 year old female with a history of fatigue, iron deficiency anemia, elevated blood pressure reading who presents today with reports of elevated blood pressure readings.  She phoned our office on 06/27/20 with reports of fluctuating blood pressures. Today she endorses a muscle soreness to her right humeral arm and at times, sometimes radiates into her right shoulder and right neck. She's been checking her BP over the last several days and is getting readings mostly at 140's/90's. Over the last two days her BP at home was 136/86, 137/83.  She has a family history of hypertension in both parents. No family history of stroke or heart attack. She denies headaches, visual changes, dizziness, chest pain, headaches.   She's been trying to work on her diet, her church is on a fast, is very motivated to lose weight. She is sedentary mostly including work and home.   Wt Readings from Last 3 Encounters:  06/29/20 165 lb (74.8 kg)  01/14/19 166 lb (75.3 kg)  12/03/16 155 lb 12.8 oz (70.7 kg)     BP Readings from Last 3 Encounters:  06/29/20 140/80  01/14/19 126/84  12/03/16 (!) 154/86     Review of Systems  Eyes: Negative for visual disturbance.  Respiratory: Negative for shortness of breath.   Cardiovascular: Negative for chest pain.  Neurological: Negative for dizziness.       Past Medical History:  Diagnosis Date  . Bladder spasm   . Chickenpox   . History of UTI   . Iron deficiency anemia      Social History   Socioeconomic History  . Marital status: Divorced     Spouse name: Not on file  . Number of children: Not on file  . Years of education: Not on file  . Highest education level: Not on file  Occupational History  . Not on file  Tobacco Use  . Smoking status: Never Smoker  . Smokeless tobacco: Never Used  Substance and Sexual Activity  . Alcohol use: Yes  . Drug use: Not on file  . Sexual activity: Not on file  Other Topics Concern  . Not on file  Social History Narrative   Divorced.   2 children.   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   Enjoys reading, going to the movies, travel, riding bikes.   Social Determinants of Health   Financial Resource Strain: Not on file  Food Insecurity: Not on file  Transportation Needs: Not on file  Physical Activity: Not on file  Stress: Not on file  Social Connections: Not on file  Intimate Partner Violence: Not on file    Past Surgical History:  Procedure Laterality Date  . CESAREAN SECTION  2001    Family History  Problem Relation Age of Onset  . Hypertension Father   . Alcohol abuse Father   . Lymphoma Maternal Grandmother   . Diabetes Maternal Grandfather   . Rectal cancer Maternal Aunt     No Known Allergies  No current outpatient medications on file prior to visit.   No current facility-administered medications  on file prior to visit.    BP 140/80   Pulse 94   Temp 97.6 F (36.4 C) (Temporal)   Ht 5\' 4"  (1.626 m)   Wt 165 lb (74.8 kg)   SpO2 96%   BMI 28.32 kg/m    Objective:   Physical Exam Constitutional:      Appearance: She is well-nourished.  Cardiovascular:     Rate and Rhythm: Normal rate and regular rhythm.  Pulmonary:     Effort: Pulmonary effort is normal.     Breath sounds: Normal breath sounds.  Musculoskeletal:     Cervical back: Neck supple.  Skin:    General: Skin is warm and dry.  Psychiatric:        Mood and Affect: Mood and affect normal.            Assessment & Plan:

## 2020-06-29 NOTE — Assessment & Plan Note (Signed)
Repeat vitamin D level pending. 

## 2020-07-02 ENCOUNTER — Encounter: Payer: Self-pay | Admitting: Primary Care

## 2020-07-22 ENCOUNTER — Encounter: Payer: 59 | Admitting: Primary Care

## 2020-08-30 ENCOUNTER — Ambulatory Visit (INDEPENDENT_AMBULATORY_CARE_PROVIDER_SITE_OTHER): Payer: 59 | Admitting: Primary Care

## 2020-08-30 ENCOUNTER — Other Ambulatory Visit: Payer: Self-pay

## 2020-08-30 ENCOUNTER — Encounter: Payer: Self-pay | Admitting: Primary Care

## 2020-08-30 VITALS — BP 134/78 | HR 71 | Temp 97.2°F | Ht 64.0 in | Wt 165.0 lb

## 2020-08-30 DIAGNOSIS — R030 Elevated blood-pressure reading, without diagnosis of hypertension: Secondary | ICD-10-CM | POA: Diagnosis not present

## 2020-08-30 DIAGNOSIS — D509 Iron deficiency anemia, unspecified: Secondary | ICD-10-CM | POA: Diagnosis not present

## 2020-08-30 DIAGNOSIS — Z0000 Encounter for general adult medical examination without abnormal findings: Secondary | ICD-10-CM | POA: Diagnosis not present

## 2020-08-30 DIAGNOSIS — E559 Vitamin D deficiency, unspecified: Secondary | ICD-10-CM | POA: Diagnosis not present

## 2020-08-30 NOTE — Patient Instructions (Signed)
Continue exercising. You should be getting 150 minutes of moderate intensity exercise weekly.  Continue to work on a healthy diet. Ensure you are consuming 64 ounces of water daily.  It was a pleasure to see you today!   Preventive Care 11-57 Years Old, Female Preventive care refers to lifestyle choices and visits with your health care provider that can promote health and wellness. This includes:  A yearly physical exam. This is also called an annual wellness visit.  Regular dental and eye exams.  Immunizations.  Screening for certain conditions.  Healthy lifestyle choices, such as: ? Eating a healthy diet. ? Getting regular exercise. ? Not using drugs or products that contain nicotine and tobacco. ? Limiting alcohol use. What can I expect for my preventive care visit? Physical exam Your health care provider will check your:  Height and weight. These may be used to calculate your BMI (body mass index). BMI is a measurement that tells if you are at a healthy weight.  Heart rate and blood pressure.  Body temperature.  Skin for abnormal spots. Counseling Your health care provider may ask you questions about your:  Past medical problems.  Family's medical history.  Alcohol, tobacco, and drug use.  Emotional well-being.  Home life and relationship well-being.  Sexual activity.  Diet, exercise, and sleep habits.  Work and work Statistician.  Access to firearms.  Method of birth control.  Menstrual cycle.  Pregnancy history. What immunizations do I need? Vaccines are usually given at various ages, according to a schedule. Your health care provider will recommend vaccines for you based on your age, medical history, and lifestyle or other factors, such as travel or where you work.   What tests do I need? Blood tests  Lipid and cholesterol levels. These may be checked every 5 years, or more often if you are over 2 years old.  Hepatitis C test.  Hepatitis B  test. Screening  Lung cancer screening. You may have this screening every year starting at age 47 if you have a 30-pack-year history of smoking and currently smoke or have quit within the past 15 years.  Colorectal cancer screening. ? All adults should have this screening starting at age 61 and continuing until age 24. ? Your health care provider may recommend screening at age 14 if you are at increased risk. ? You will have tests every 1-10 years, depending on your results and the type of screening test.  Diabetes screening. ? This is done by checking your blood sugar (glucose) after you have not eaten for a while (fasting). ? You may have this done every 1-3 years.  Mammogram. ? This may be done every 1-2 years. ? Talk with your health care provider about when you should start having regular mammograms. This may depend on whether you have a family history of breast cancer.  BRCA-related cancer screening. This may be done if you have a family history of breast, ovarian, tubal, or peritoneal cancers.  Pelvic exam and Pap test. ? This may be done every 3 years starting at age 75. ? Starting at age 81, this may be done every 5 years if you have a Pap test in combination with an HPV test. Other tests  STD (sexually transmitted disease) testing, if you are at risk.  Bone density scan. This is done to screen for osteoporosis. You may have this scan if you are at high risk for osteoporosis. Talk with your health care provider about your test results, treatment  options, and if necessary, the need for more tests. Follow these instructions at home: Eating and drinking  Eat a diet that includes fresh fruits and vegetables, whole grains, lean protein, and low-fat dairy products.  Take vitamin and mineral supplements as recommended by your health care provider.  Do not drink alcohol if: ? Your health care provider tells you not to drink. ? You are pregnant, may be pregnant, or are planning  to become pregnant.  If you drink alcohol: ? Limit how much you have to 0-1 drink a day. ? Be aware of how much alcohol is in your drink. In the [place] one drink equals one 12 oz bottle of beer (355 mL), one 5 oz glass of wine (148 mL), or one 1 oz glass of hard liquor (44 mL).   Lifestyle  Take daily care of your teeth and gums. Brush your teeth every morning and night with fluoride toothpaste. Floss one time each day.  Stay active. Exercise for at least 30 minutes 5 or more days each week.  Do not use any products that contain nicotine or tobacco, such as cigarettes, e-cigarettes, and chewing tobacco. If you need help quitting, ask your health care provider.  Do not use drugs.  If you are sexually active, practice safe sex. Use a condom or other form of protection to prevent STIs (sexually transmitted infections).  If you do not wish to become pregnant, use a form of birth control. If you plan to become pregnant, see your health care provider for a prepregnancy visit.  If told by your health care provider, take low-dose aspirin daily starting at age 50.  Find healthy ways to cope with stress, such as: ? Meditation, yoga, or listening to music. ? Journaling. ? Talking to a trusted person. ? Spending time with friends and family. Safety  Always wear your seat belt while driving or riding in a vehicle.  Do not drive: ? If you have been drinking alcohol. Do not ride with someone who has been drinking. ? When you are tired or distracted. ? While texting.  Wear a helmet and other protective equipment during sports activities.  If you have firearms in your house, make sure you follow all gun safety procedures. What's next?  Visit your health care provider once a year for an annual wellness visit.  Ask your health care provider how often you should have your eyes and teeth checked.  Stay up to date on all vaccines. This information is not intended to replace advice given to you  by your health care provider. Make sure you discuss any questions you have with your health care provider. Document Revised: 03/08/2020 Document Reviewed: 02/13/2018 Elsevier Patient Education  2021 Elsevier Inc.  

## 2020-08-30 NOTE — Assessment & Plan Note (Signed)
Immunizations UTD except for Shingrix, she declines today. Pap smear and mammogram UTD, follows with GYN, will get records. Colonoscopy UTD due in 2027.  Discussed the importance of a healthy diet and regular exercise in order for weight loss, and to reduce the risk of any potential medical problems.  Exam today stable. Labs reviewed.

## 2020-08-30 NOTE — Assessment & Plan Note (Signed)
Recent IBC and CBC stable. Continue to monitor.

## 2020-08-30 NOTE — Assessment & Plan Note (Signed)
Improved today, home readings even better.  Continue to monitor.

## 2020-08-30 NOTE — Progress Notes (Signed)
Subjective:    Patient ID: Angela Harmon, female    DOB: 1965-07-20, 55 y.o.   MRN: [id number]  HPI  Angela Harmon is a very pleasant 55 y.o. female who presents today for complete physical.  Immunizations: -Tetanus: 2020 -Influenza: Completed this season  -Covid-19: completed three vaccines -Shingles: Never completed, declines   Diet: She endorses a healthy diet.  Exercise: She is exercising at the gym  Eye exam: Completes annually  Dental exam: Completes semi-annually   Pap Smear: 2021 Mammogram: March 2022 Colonoscopy: 2017, due 2027  BP Readings from Last 3 Encounters:  08/30/20 134/78  06/29/20 140/80  01/14/19 126/84   Wt Readings from Last 3 Encounters:  08/30/20 165 lb (74.8 kg)  06/29/20 165 lb (74.8 kg)  01/14/19 166 lb (75.3 kg)   She is checking BP at home which is running 123/73, 127/82, 129/77, 127/77, 128/78.    Review of Systems  Constitutional: Negative for unexpected weight change.  HENT: Negative for rhinorrhea.   Eyes: Negative for visual disturbance.  Respiratory: Negative for cough and shortness of breath.   Cardiovascular: Negative for chest pain.  Gastrointestinal: Negative for constipation and diarrhea.  Genitourinary: Negative for difficulty urinating.  Musculoskeletal: Negative for arthralgias and myalgias.  Skin: Negative for rash.  Allergic/Immunologic: Negative for environmental allergies.  Neurological: Negative for dizziness and headaches.  Psychiatric/Behavioral: The patient is not nervous/anxious.          Past Medical History:  Diagnosis Date  . Bladder spasm   . Chickenpox   . History of UTI   . Iron deficiency anemia     Social History   Socioeconomic History  . Marital status: Divorced    Spouse name: Not on file  . Number of children: Not on file  . Years of education: Not on file  . Highest education level: Not on file  Occupational History  . Not on file  Tobacco Use  . Smoking status: Never Smoker  .  Smokeless tobacco: Never Used  Substance and Sexual Activity  . Alcohol use: Yes  . Drug use: Not on file  . Sexual activity: Not on file  Other Topics Concern  . Not on file  Social History Narrative   Divorced.   2 children.   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   Enjoys reading, going to the movies, travel, riding bikes.   Social Determinants of Health   Financial Resource Strain: Not on file  Food Insecurity: Not on file  Transportation Needs: Not on file  Physical Activity: Not on file  Stress: Not on file  Social Connections: Not on file  Intimate Partner Violence: Not on file    Past Surgical History:  Procedure Laterality Date  . CESAREAN SECTION  2001    Family History  Problem Relation Age of Onset  . Hypertension Father   . Alcohol abuse Father   . Lymphoma Maternal Grandmother   . Diabetes Maternal Grandfather   . Rectal cancer Maternal Aunt     No Known Allergies  No current outpatient medications on file prior to visit.   No current facility-administered medications on file prior to visit.    BP 134/78   Pulse 71   Temp (!) 97.2 F (36.2 C) (Temporal)   Ht 5\' 4"  (1.626 m)   Wt 165 lb (74.8 kg)   SpO2 98%   BMI 28.32 kg/m  Objective:   Physical Exam HENT:     Right Ear: Tympanic membrane and  ear canal normal.     Left Ear: Tympanic membrane and ear canal normal.     Nose: Nose normal.  Eyes:     Conjunctiva/sclera: Conjunctivae normal.     Pupils: Pupils are equal, round, and reactive to light.  Neck:     Thyroid: No thyromegaly.  Cardiovascular:     Rate and Rhythm: Normal rate and regular rhythm.     Heart sounds: No murmur heard.   Pulmonary:     Effort: Pulmonary effort is normal.     Breath sounds: Normal breath sounds. No rales.  Abdominal:     General: Bowel sounds are normal.     Palpations: Abdomen is soft.     Tenderness: There is no abdominal tenderness.  Musculoskeletal:        General: Normal range of  motion.     Cervical back: Neck supple.  Lymphadenopathy:     Cervical: No cervical adenopathy.  Skin:    General: Skin is warm and dry.     Findings: No rash.  Neurological:     Mental Status: She is alert and oriented to person, place, and time.     Cranial Nerves: No cranial nerve deficit.     Deep Tendon Reflexes: Reflexes are normal and symmetric.  Psychiatric:        Mood and Affect: Mood normal.           Assessment & Plan:      This visit occurred during the SARS-CoV-2 public health emergency.  Safety protocols were in place, including screening questions prior to the visit, additional usage of staff PPE, and extensive cleaning of exam room while observing appropriate contact time as indicated for disinfecting solutions.

## 2020-08-30 NOTE — Assessment & Plan Note (Signed)
Compliant to vitamin D daily, vitamin D level from January 2022 reviewed and stable.

## 2020-08-31 LAB — HM MAMMOGRAPHY

## 2020-09-01 LAB — HM PAP SMEAR

## 2020-09-15 ENCOUNTER — Encounter: Payer: Self-pay | Admitting: Primary Care

## 2021-03-11 ENCOUNTER — Other Ambulatory Visit: Payer: Self-pay

## 2021-03-11 ENCOUNTER — Ambulatory Visit (HOSPITAL_COMMUNITY)
Admission: EM | Admit: 2021-03-11 | Discharge: 2021-03-11 | Disposition: A | Discharge status: Home / Self Care | Payer: 59

## 2021-03-11 ENCOUNTER — Encounter (HOSPITAL_COMMUNITY): Payer: Self-pay | Admitting: Emergency Medicine

## 2021-03-11 DIAGNOSIS — U071 COVID-19: Secondary | ICD-10-CM

## 2021-03-11 MED ORDER — PROMETHAZINE-DM 6.25-15 MG/5ML PO SYRP: 5.0000 mL | ORAL_SOLUTION | Freq: Four times a day (QID) | ORAL | 0 refills | Status: DC | PRN

## 2021-03-11 MED ORDER — ONDANSETRON 4 MG PO TBDP: 4.0000 mg | ORAL_TABLET | Freq: Three times a day (TID) | ORAL | 0 refills | Status: DC | PRN

## 2021-03-11 NOTE — ED Provider Notes (Signed)
[facility]    CSN: [id number] Arrival date & time: 03/11/21  1607      History   Chief Complaint Chief Complaint  Patient presents with   Cough   covid positive    HPI Angela Harmon is a 55 y.o. female.   Patient presenting today with 2-day history of fatigue, dry cough, sore throat, congestion, nausea.  Denies chest pain, shortness of breath, abdominal pain, diarrhea.  So far taking TheraFlu with mild temporary relief.  COVID test this morning was positive.  No known history of pulmonary disease or immune compromise.   Past Medical History:  Diagnosis Date   Bladder spasm    Chickenpox    History of UTI    Iron deficiency anemia     Patient Active Problem List   Diagnosis Date Noted   Vitamin D deficiency 01/14/2019   Preventative health care 01/14/2019   Iron deficiency anemia 01/14/2019   Fatigue 12/03/2016   Elevated blood pressure reading 12/03/2016    Past Surgical History:  Procedure Laterality Date   CESAREAN SECTION  2001    OB History   No obstetric history on file.      Home Medications    Prior to Admission medications   Medication Sig Start Date End Date Taking? Authorizing Provider  ondansetron (ZOFRAN ODT) 4 MG disintegrating tablet Take 1 tablet (4 mg total) by mouth every 8 (eight) hours as needed for nausea or vomiting. 03/11/21  Yes Particia Nearing, PA-C  promethazine-dextromethorphan (PROMETHAZINE-DM) 6.25-15 MG/5ML syrup Take 5 mLs by mouth 4 (four) times daily as needed for cough. 03/11/21  Yes Particia Nearing, PA-C  doxycycline (ADOXA) 100 MG tablet SMARTSIG:1 Tablet(s) By Mouth Every 12 Hours 01/27/21   [provider]    Family History Family History  Problem Relation Age of Onset   Diabetes Mother    Hypertension Mother    Dementia Mother    Hypertension Father    Alcohol abuse Father    Lymphoma Maternal Grandmother    Diabetes Maternal Grandfather    Rectal cancer Maternal Aunt      Social History Social History   Tobacco Use   Smoking status: Never   Smokeless tobacco: Never  Vaping Use   Vaping Use: Never used  Substance Use Topics   Alcohol use: Not Currently   Drug use: Never     Allergies   Patient has no known allergies.   Review of Systems Review of Systems Per HPI  Physical Exam Triage Vital Signs ED Triage Vitals  Enc Vitals Group     BP 03/11/21 1622 115/80     Pulse Rate 03/11/21 1622 (!) 110     Resp 03/11/21 1622 18     Temp 03/11/21 1622 98.9 F (37.2 C)     Temp Source 03/11/21 1622 Oral     SpO2 03/11/21 1622 95 %     Weight --      Height --      Head Circumference --      Peak Flow --      Pain Score 03/11/21 1618 0     Pain Loc --      Pain Edu? --      Excl. in GC? --    No data found.  Updated Vital Signs BP 115/80 (BP Location: Left Arm)   Pulse (!) 110   Temp 98.9 F (37.2 C) (Oral)   Resp 18   LMP 11/27/2018 (Within Days)  SpO2 95%   Visual Acuity Right Eye Distance:   Left Eye Distance:   Bilateral Distance:    Right Eye Near:   Left Eye Near:    Bilateral Near:     Physical Exam Vitals and nursing note reviewed.  Constitutional:      Appearance: Normal appearance. She is not ill-appearing.  HENT:     Head: Atraumatic.     Right Ear: Tympanic membrane normal.     Left Ear: Tympanic membrane normal.     Nose: Rhinorrhea present.     Mouth/Throat:     Mouth: Mucous membranes are moist.     Pharynx: Posterior oropharyngeal erythema present. No oropharyngeal exudate.  Eyes:     Extraocular Movements: Extraocular movements intact.     Conjunctiva/sclera: Conjunctivae normal.  Cardiovascular:     Rate and Rhythm: Normal rate and regular rhythm.     Heart sounds: Normal heart sounds.  Pulmonary:     Effort: Pulmonary effort is normal. No respiratory distress.     Breath sounds: Normal breath sounds. No wheezing or rales.  Abdominal:     General: Bowel sounds are normal. There is no  distension.     Palpations: Abdomen is soft.     Tenderness: There is no abdominal tenderness. There is no guarding.  Musculoskeletal:        General: Normal range of motion.     Cervical back: Normal range of motion and neck supple.  Skin:    General: Skin is warm and dry.  Neurological:     Mental Status: She is alert and oriented to person, place, and time.     Motor: No weakness.     Gait: Gait normal.  Psychiatric:        Mood and Affect: Mood normal.        Thought Content: Thought content normal.        Judgment: Judgment normal.   UC Treatments / Results  Labs (all labs ordered are listed, but only abnormal results are displayed) Labs Reviewed - No data to display  EKG   Radiology No results found.  Procedures Procedures (including critical care time)  Medications Ordered in UC Medications - No data to display  Initial Impression / Assessment and Plan / UC Course  I have reviewed the triage vital signs and the nursing notes.  Pertinent labs & imaging results that were available during my care of the patient were reviewed by me and considered in my medical decision making (see chart for details).     Overall stable appearing.  No acute distress.  Mildly tachycardic in triage but otherwise vital signs reassuring.  Known to be COVID-positive as of testing at home today.  We will treat with Zofran as needed, Phenergan DM, supportive over-the-counter medications and home care.  Discussed quarantine protocol.  Return for acutely worsening symptoms.  Final Clinical Impressions(s) / UC Diagnoses   Final diagnoses:  COVID-19   Discharge Instructions   None    ED Prescriptions     Medication Sig Dispense Auth. Provider   promethazine-dextromethorphan (PROMETHAZINE-DM) 6.25-15 MG/5ML syrup Take 5 mLs by mouth 4 (four) times daily as needed for cough. 100 mL Particia Nearing, PA-C   ondansetron (ZOFRAN ODT) 4 MG disintegrating tablet Take 1 tablet (4 mg  total) by mouth every 8 (eight) hours as needed for nausea or vomiting. 20 tablet Particia Nearing, [place]      PDMP not reviewed this encounter.   Roosvelt Maser  [place], PA-C 03/11/21 1714

## 2021-03-11 NOTE — ED Triage Notes (Addendum)
Symptoms started Thursday, but significantly worsened yesterday.  Complains of fatigue, dry cough, fatigue and initially a scratchy throat, but this has gone away  Home covid test today was positive

## 2022-10-25 ENCOUNTER — Ambulatory Visit
Admission: EM | Admit: 2022-10-25 | Discharge: 2022-10-25 | Disposition: A | Discharge status: Home / Self Care | Payer: 59 | Attending: Internal Medicine | Admitting: Internal Medicine

## 2022-10-25 DIAGNOSIS — B349 Viral infection, unspecified: Secondary | ICD-10-CM | POA: Insufficient documentation

## 2022-10-25 DIAGNOSIS — J029 Acute pharyngitis, unspecified: Secondary | ICD-10-CM

## 2022-10-25 DIAGNOSIS — Z1152 Encounter for screening for COVID-19: Secondary | ICD-10-CM | POA: Diagnosis not present

## 2022-10-25 LAB — POCT RAPID STREP A (OFFICE): Rapid Strep A Screen: NEGATIVE

## 2022-10-25 MED ORDER — BENZONATATE 200 MG PO CAPS: 200.0000 mg | ORAL_CAPSULE | Freq: Three times a day (TID) | ORAL | 0 refills | Status: DC | PRN

## 2022-10-25 MED ORDER — LIDOCAINE VISCOUS HCL 2 % MT SOLN
15.0000 mL | OROMUCOSAL | 0 refills | Status: DC | PRN
Start: 2022-10-25 — End: 2023-02-28

## 2022-10-25 NOTE — Discharge Instructions (Signed)
Your rapid strep test was negative in clinic.  We will send this for culture and contact you if that result is positive.  We will also send your COVID testing out and we will contact you if that is positive as well You may use lidocaine gargle as needed for your sore throat.  Gargle and spit do not swallow Tessalon as needed for cough Rest and fluids Salt water gargles and warm liquids such as tea and honey Over-the-counter Tylenol or ibuprofen Follow-up with your PCP if your symptoms do not improve Please go to the ER if you develop any worsening symptoms

## 2022-10-25 NOTE — ED Triage Notes (Signed)
Pt presents to UC w/ c/o sore throat and pain which radiates to ears x3 days. Nausea and nasal congestion x2 days.

## 2022-10-25 NOTE — ED Provider Notes (Signed)
[facility]    CSN: [id number] Arrival date & time: 10/25/22  1336      History   Chief Complaint Chief Complaint  Patient presents with   Sore Throat    HPI Angela Harmon is a 57 y.o. female  presents for evaluation of URI symptoms for 3 days. Patient reports associated symptoms of sore throat, cough, congestion, ear pain, nausea. Denies V/D, fevers, body aches, shortness of breath. Patient does not have a hx of asthma or smoking. No known sick contacts.  Pt has taken DayQuil NyQuil OTC for symptoms. Pt has no other concerns at this time.    Sore Throat    Past Medical History:  Diagnosis Date   Bladder spasm    Chickenpox    History of UTI    Iron deficiency anemia     Patient Active Problem List   Diagnosis Date Noted   Vitamin D deficiency 01/14/2019   Preventative health care 01/14/2019   Iron deficiency anemia 01/14/2019   Fatigue 12/03/2016   Elevated blood pressure reading 12/03/2016    Past Surgical History:  Procedure Laterality Date   CESAREAN SECTION  2001    OB History   No obstetric history on file.      Home Medications    Prior to Admission medications   Medication Sig Start Date End Date Taking? Authorizing Provider  benzonatate (TESSALON) 200 MG capsule Take 1 capsule (200 mg total) by mouth 3 (three) times daily as needed for cough. 10/25/22  Yes Radford Pax, NP  lidocaine (XYLOCAINE) 2 % solution Use as directed 15 mLs in the mouth or throat every 4 (four) hours as needed (sore throat). Gargle and spit do not swallow 10/25/22  Yes Radford Pax, NP  doxycycline (ADOXA) 100 MG tablet SMARTSIG:1 Tablet(s) By Mouth Every 12 Hours 01/27/21   [provider]  ondansetron (ZOFRAN ODT) 4 MG disintegrating tablet Take 1 tablet (4 mg total) by mouth every 8 (eight) hours as needed for nausea or vomiting. 03/11/21   Particia Nearing, PA-C  promethazine-dextromethorphan (PROMETHAZINE-DM) 6.25-15 MG/5ML syrup Take 5 mLs by mouth  4 (four) times daily as needed for cough. 03/11/21   Particia Nearing, PA-C    Family History Family History  Problem Relation Age of Onset   Diabetes Mother    Hypertension Mother    Dementia Mother    Hypertension Father    Alcohol abuse Father    Lymphoma Maternal Grandmother    Diabetes Maternal Grandfather    Rectal cancer Maternal Aunt     Social History Social History   Tobacco Use   Smoking status: Never   Smokeless tobacco: Never  Vaping Use   Vaping Use: Never used  Substance Use Topics   Alcohol use: Not Currently   Drug use: Never     Allergies   Patient has no known allergies.   Review of Systems Review of Systems  HENT:  Positive for congestion, ear pain and sore throat.   Respiratory:  Positive for cough.      Physical Exam Triage Vital Signs ED Triage Vitals  Enc Vitals Group     BP 10/25/22 1408 (!) 150/89     Pulse Rate 10/25/22 1408 88     Resp 10/25/22 1408 16     Temp 10/25/22 1408 98.9 F (37.2 C)     Temp Source 10/25/22 1408 Oral     SpO2 10/25/22 1408 97 %     Weight --  Height --      Head Circumference --      Peak Flow --      Pain Score 10/25/22 1407 0     Pain Loc --      Pain Edu? --      Excl. in GC? --    No data found.  Updated Vital Signs BP (!) 150/89 (BP Location: Left Arm)   Pulse 88   Temp 98.9 F (37.2 C) (Oral)   Resp 16   LMP 11/27/2018 (Within Days)   SpO2 97%   Visual Acuity Right Eye Distance:   Left Eye Distance:   Bilateral Distance:    Right Eye Near:   Left Eye Near:    Bilateral Near:     Physical Exam Vitals and nursing note reviewed.  Constitutional:      General: She is not in acute distress.    Appearance: She is well-developed. She is not ill-appearing.  HENT:     Head: Normocephalic and atraumatic.     Right Ear: Tympanic membrane and ear canal normal.     Left Ear: Tympanic membrane and ear canal normal.     Nose: Congestion present.     Mouth/Throat:      Mouth: Mucous membranes are moist.     Pharynx: Oropharynx is clear. Uvula midline. Posterior oropharyngeal erythema present.     Tonsils: No tonsillar exudate or tonsillar abscesses.  Eyes:     Conjunctiva/sclera: Conjunctivae normal.     Pupils: Pupils are equal, round, and reactive to light.  Cardiovascular:     Rate and Rhythm: Normal rate and regular rhythm.     Heart sounds: Normal heart sounds.  Pulmonary:     Effort: Pulmonary effort is normal.     Breath sounds: Normal breath sounds.  Musculoskeletal:     Cervical back: Normal range of motion and neck supple.  Lymphadenopathy:     Cervical: No cervical adenopathy.  Skin:    General: Skin is warm and dry.  Neurological:     General: No focal deficit present.     Mental Status: She is alert and oriented to person, place, and time.  Psychiatric:        Mood and Affect: Mood normal.        Behavior: Behavior normal.      UC Treatments / Results  Labs (all labs ordered are listed, but only abnormal results are displayed) Labs Reviewed  CULTURE, GROUP A STREP ([facility])  SARS CORONAVIRUS 2 (TAT 6-24 HRS)  POCT RAPID STREP A (OFFICE)    EKG   Radiology No results found.  Procedures Procedures (including critical care time)  Medications Ordered in UC Medications - No data to display  Initial Impression / Assessment and Plan / UC Course  I have reviewed the triage vital signs and the nursing notes.  Pertinent labs & imaging results that were available during my care of the patient were reviewed by me and considered in my medical decision making (see chart for details).     Reviewed exam and symptoms with patient.  No red flags. Negative rapid strep, will culture.  COVID PCR and will contact if positive Discussed viral illness and symptomatic treatment Lidocaine gargle as needed Tessalon as needed for cough Rest and fluids PCP follow-up if symptoms do not improve ER precautions reviewed and patient verbalized  understanding Final Clinical Impressions(s) / UC Diagnoses   Final diagnoses:  Sore throat  Viral illness     Discharge  Instructions      Your rapid strep test was negative in clinic.  We will send this for culture and contact you if that result is positive.  We will also send your COVID testing out and we will contact you if that is positive as well You may use lidocaine gargle as needed for your sore throat.  Gargle and spit do not swallow Tessalon as needed for cough Rest and fluids Salt water gargles and warm liquids such as tea and honey Over-the-counter Tylenol or ibuprofen Follow-up with your PCP if your symptoms do not improve Please go to the ER if you develop any worsening symptoms   ED Prescriptions     Medication Sig Dispense Auth. Provider   lidocaine (XYLOCAINE) 2 % solution Use as directed 15 mLs in the mouth or throat every 4 (four) hours as needed (sore throat). Gargle and spit do not swallow 100 mL Radford Pax, NP   benzonatate (TESSALON) 200 MG capsule Take 1 capsule (200 mg total) by mouth 3 (three) times daily as needed for cough. 20 capsule Radford Pax, NP      PDMP not reviewed this encounter.   Radford Pax, NP 10/25/22 1452

## 2022-10-26 LAB — SARS CORONAVIRUS 2 (TAT 6-24 HRS): SARS Coronavirus 2: NEGATIVE

## 2022-10-28 LAB — CULTURE, GROUP A STREP (THRC)

## 2022-11-20 LAB — HM MAMMOGRAPHY

## 2022-11-22 LAB — HM PAP SMEAR: HM Pap smear: NEGATIVE

## 2023-02-28 ENCOUNTER — Ambulatory Visit (INDEPENDENT_AMBULATORY_CARE_PROVIDER_SITE_OTHER): Payer: 59 | Admitting: Primary Care

## 2023-02-28 ENCOUNTER — Encounter: Payer: Self-pay | Admitting: Primary Care

## 2023-02-28 VITALS — BP 142/86 | HR 97 | Temp 98.2°F | Ht 64.0 in | Wt 163.0 lb

## 2023-02-28 DIAGNOSIS — R030 Elevated blood-pressure reading, without diagnosis of hypertension: Secondary | ICD-10-CM

## 2023-02-28 DIAGNOSIS — Z0000 Encounter for general adult medical examination without abnormal findings: Secondary | ICD-10-CM | POA: Diagnosis not present

## 2023-02-28 DIAGNOSIS — D509 Iron deficiency anemia, unspecified: Secondary | ICD-10-CM | POA: Diagnosis not present

## 2023-02-28 DIAGNOSIS — Z23 Encounter for immunization: Secondary | ICD-10-CM

## 2023-02-28 LAB — COMPREHENSIVE METABOLIC PANEL
ALT: 14 U/L (ref 0–35)
AST: 14 U/L (ref 0–37)
Albumin: 4 g/dL (ref 3.5–5.2)
Alkaline Phosphatase: 103 U/L (ref 39–117)
BUN: 16 mg/dL (ref 6–23)
CO2: 27 meq/L (ref 19–32)
Calcium: 9.2 mg/dL (ref 8.4–10.5)
Chloride: 105 meq/L (ref 96–112)
Creatinine, Ser: 0.68 mg/dL (ref 0.40–1.20)
GFR: 95.27 mL/min (ref >60.00)
Glucose, Bld: 96 mg/dL (ref 70–99)
Potassium: 4.2 meq/L (ref 3.5–5.1)
Sodium: 141 meq/L (ref 135–145)
Total Bilirubin: 0.8 mg/dL (ref 0.2–1.2)
Total Protein: 6.9 g/dL (ref 6.0–8.3)

## 2023-02-28 LAB — LIPID PANEL
Cholesterol: 188 mg/dL (ref 0–200)
HDL: 56.7 mg/dL (ref >39.00)
LDL Cholesterol: 112 mg/dL — ABNORMAL HIGH (ref 0–99)
NonHDL: 130.98
Total CHOL/HDL Ratio: 3
Triglycerides: 97 mg/dL (ref 0.0–149.0)
VLDL: 19.4 mg/dL (ref 0.0–40.0)

## 2023-02-28 LAB — IBC + FERRITIN
Ferritin: 77.9 ng/mL (ref 10.0–291.0)
Iron: 68 ug/dL (ref 42–145)
Saturation Ratios: 23.7 % (ref 20.0–50.0)
TIBC: 287 ug/dL (ref 250.0–450.0)
Transferrin: 205 mg/dL — ABNORMAL LOW (ref 212.0–360.0)

## 2023-02-28 LAB — CBC
HCT: 41.7 % (ref 36.0–46.0)
Hemoglobin: 13.7 g/dL (ref 12.0–15.0)
MCHC: 32.9 g/dL (ref 30.0–36.0)
MCV: 82.8 fl (ref 78.0–100.0)
Platelets: 311 10*3/uL (ref 150.0–400.0)
RBC: 5.04 Mil/uL (ref 3.87–5.11)
RDW: 13.6 % (ref 11.5–15.5)
WBC: 7.5 10*3/uL (ref 4.0–10.5)

## 2023-02-28 NOTE — Assessment & Plan Note (Signed)
Shingrix and influenza vaccines provided today. Pap smear UTD.  Follows with GYN Mammogram up-to-date, completes this at GYN office Colonoscopy UTD, due 2027  Discussed the importance of a healthy diet and regular exercise in order for weight loss, and to reduce the risk of further co-morbidity.  Exam stable. Labs pending.  Follow up in 1 year for repeat physical.

## 2023-02-28 NOTE — Assessment & Plan Note (Signed)
Above goal today, slightly improved on recheck.  Long discussion regarding lifestyle changes to be made to help lower blood pressure. She will start monitoring and report if readings are consistently at or above 140/90.

## 2023-02-28 NOTE — Assessment & Plan Note (Signed)
Repeat CBC and iron studies pending. 

## 2023-02-28 NOTE — Progress Notes (Signed)
Subjective:    Patient ID: Angela Harmon, female    DOB: 1966/01/17, 57 y.o.   MRN: [id number]  HPI  Angela Harmon is a very pleasant 57 y.o. female who presents today for complete physical and follow up of chronic conditions.  Immunizations: -Tetanus: Completed in 2020 -Influenza: Influenza vaccine provided today. -Shingles: Never completed.   Diet: Fair diet.  Exercise: No regular exercise. Walking and playing golf.   Eye exam: Completed in March 2023 Dental exam: Completes semi-annually    Pap Smear: Completed in March 2022 Mammogram: Completed in 2024 at GYN office  Colonoscopy: Completed in 2017, due 2027   BP Readings from Last 3 Encounters:  02/28/23 (!) 142/86  10/25/22 (!) 150/89  03/11/21 115/80   She does not check her BP regularly. She denies headaches, dizziness, chest pain. She has a family history of hypertension in both parents.  Since retirement her diet is not as healthy as she is no longer cooking at home.  She is eating a lot of takeout food.  She is not exercising.   Review of Systems  Constitutional:  Negative for unexpected weight change.  HENT:  Negative for rhinorrhea.   Respiratory:  Negative for cough and shortness of breath.   Cardiovascular:  Negative for chest pain.  Gastrointestinal:  Negative for constipation and diarrhea.  Genitourinary:  Negative for difficulty urinating.  Musculoskeletal:  Negative for arthralgias and myalgias.  Skin:  Negative for rash.  Allergic/Immunologic: Negative for environmental allergies.  Neurological:  Negative for dizziness, numbness and headaches.  Psychiatric/Behavioral:  The patient is not nervous/anxious.          Past Medical History:  Diagnosis Date   Bladder spasm    Chickenpox    History of UTI    Iron deficiency anemia     Social History   Socioeconomic History   Marital status: Divorced    Spouse name: Not on file   Number of children: Not on file   Years of education: Not on file    Highest education level: Not on file  Occupational History   Not on file  Tobacco Use   Smoking status: Never   Smokeless tobacco: Never  Vaping Use   Vaping status: Never Used  Substance and Sexual Activity   Alcohol use: Not Currently   Drug use: Never   Sexual activity: Not on file  Other Topics Concern   Not on file  Social History Narrative   Divorced.   2 children.   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   Enjoys reading, going to the movies, travel, riding bikes.   Social Determinants of Health   Financial Resource Strain: Not on file  Food Insecurity: Not on file  Transportation Needs: Not on file  Physical Activity: Not on file  Stress: Not on file  Social Connections: Not on file  Intimate Partner Violence: Not on file    Past Surgical History:  Procedure Laterality Date   CESAREAN SECTION  2001    Family History  Problem Relation Age of Onset   Diabetes Mother    Hypertension Mother    Dementia Mother    Hypertension Father    Alcohol abuse Father    Lymphoma Maternal Grandmother    Diabetes Maternal Grandfather    Rectal cancer Maternal Aunt     No Known Allergies  No current outpatient medications on file prior to visit.   No current facility-administered medications on file prior to visit.  BP (!) 142/86   Pulse 97   Temp 98.2 F (36.8 C) (Temporal)   Ht 5\' 4"  (1.626 m)   Wt 163 lb (73.9 kg)   LMP 11/27/2018 (Within Days)   SpO2 98%   BMI 27.98 kg/m  Objective:   Physical Exam HENT:     Right Ear: Tympanic membrane and ear canal normal.     Left Ear: Tympanic membrane and ear canal normal.     Nose: Nose normal.  Eyes:     Conjunctiva/sclera: Conjunctivae normal.     Pupils: Pupils are equal, round, and reactive to light.  Neck:     Thyroid: No thyromegaly.  Cardiovascular:     Rate and Rhythm: Normal rate and regular rhythm.     Heart sounds: No murmur heard. Pulmonary:     Effort: Pulmonary effort is normal.      Breath sounds: Normal breath sounds. No rales.  Abdominal:     General: Bowel sounds are normal.     Palpations: Abdomen is soft.     Tenderness: There is no abdominal tenderness.  Musculoskeletal:        General: Normal range of motion.     Cervical back: Neck supple.  Lymphadenopathy:     Cervical: No cervical adenopathy.  Skin:    General: Skin is warm and dry.     Findings: No rash.  Neurological:     Mental Status: She is alert and oriented to person, place, and time.     Cranial Nerves: No cranial nerve deficit.     Deep Tendon Reflexes: Reflexes are normal and symmetric.  Psychiatric:        Mood and Affect: Mood normal.           Assessment & Plan:  Preventative health care Assessment & Plan: Shingrix and influenza vaccines provided today. Pap smear UTD.  Follows with GYN Mammogram up-to-date, completes this at GYN office Colonoscopy UTD, due 2027  Discussed the importance of a healthy diet and regular exercise in order for weight loss, and to reduce the risk of further co-morbidity.  Exam stable. Labs pending.  Follow up in 1 year for repeat physical.   Orders: -     Lipid panel -     Comprehensive metabolic panel  Iron deficiency anemia, unspecified iron deficiency anemia type Assessment & Plan: Repeat CBC and iron studies pending.  Orders: -     CBC -     IBC + Ferritin  Elevated blood pressure reading Assessment & Plan: Above goal today, slightly improved on recheck.  Long discussion regarding lifestyle changes to be made to help lower blood pressure. She will start monitoring and report if readings are consistently at or above 140/90.         Doreene Nest, NP

## 2023-02-28 NOTE — Patient Instructions (Signed)
Stop by the lab prior to leaving today. I will notify you of your results once received.   Start monitoring your blood pressure daily, around the same time of day, for the next 2-3 weeks.  Ensure that you have rested for 30 minutes prior to checking your blood pressure.   Record your readings and notify me if you see numbers consistently at or above 140 on top and/or 90 on bottom.  Schedule a nurse visit for 2 to 6 months from now to receive your second shingles shot.  It was a pleasure to see you today!

## 2023-09-03 ENCOUNTER — Ambulatory Visit (INDEPENDENT_AMBULATORY_CARE_PROVIDER_SITE_OTHER): Payer: 59

## 2023-09-03 DIAGNOSIS — Z23 Encounter for immunization: Secondary | ICD-10-CM

## 2023-09-03 NOTE — Progress Notes (Signed)
 Per orders of Mayra Reel, DPN AGNP-C, injection of shingrix given by Lewanda Rife in right deltoid. Patient tolerated injection well. P

## 2023-11-26 LAB — HM MAMMOGRAPHY

## 2023-11-29 LAB — HM PAP SMEAR

## 2024-03-03 ENCOUNTER — Encounter: Payer: Self-pay | Admitting: Primary Care

## 2024-03-03 ENCOUNTER — Ambulatory Visit: Admitting: Primary Care

## 2024-03-03 VITALS — BP 144/82 | HR 79 | Temp 97.7°F | Ht 63.25 in | Wt 158.4 lb

## 2024-03-03 DIAGNOSIS — E559 Vitamin D deficiency, unspecified: Secondary | ICD-10-CM | POA: Diagnosis not present

## 2024-03-03 DIAGNOSIS — Z23 Encounter for immunization: Secondary | ICD-10-CM | POA: Diagnosis not present

## 2024-03-03 DIAGNOSIS — Z0000 Encounter for general adult medical examination without abnormal findings: Secondary | ICD-10-CM

## 2024-03-03 DIAGNOSIS — E785 Hyperlipidemia, unspecified: Secondary | ICD-10-CM | POA: Diagnosis not present

## 2024-03-03 DIAGNOSIS — R030 Elevated blood-pressure reading, without diagnosis of hypertension: Secondary | ICD-10-CM | POA: Diagnosis not present

## 2024-03-03 LAB — CBC
HCT: 38.5 % (ref 36.0–46.0)
Hemoglobin: 12.9 g/dL (ref 12.0–15.0)
MCHC: 33.4 g/dL (ref 30.0–36.0)
MCV: 82.2 fl (ref 78.0–100.0)
Platelets: 291 K/uL (ref 150.0–400.0)
RBC: 4.68 Mil/uL (ref 3.87–5.11)
RDW: 13.5 % (ref 11.5–15.5)
WBC: 5.8 K/uL (ref 4.0–10.5)

## 2024-03-03 LAB — LIPID PANEL
Cholesterol: 175 mg/dL (ref 0–200)
HDL: 52.2 mg/dL (ref >39.00)
LDL Cholesterol: 106 mg/dL — ABNORMAL HIGH (ref 0–99)
NonHDL: 122.5
Total CHOL/HDL Ratio: 3
Triglycerides: 85 mg/dL (ref 0.0–149.0)
VLDL: 17 mg/dL (ref 0.0–40.0)

## 2024-03-03 LAB — COMPREHENSIVE METABOLIC PANEL WITH GFR
ALT: 12 U/L (ref 0–35)
AST: 14 U/L (ref 0–37)
Albumin: 4 g/dL (ref 3.5–5.2)
Alkaline Phosphatase: 85 U/L (ref 39–117)
BUN: 13 mg/dL (ref 6–23)
CO2: 26 meq/L (ref 19–32)
Calcium: 8.9 mg/dL (ref 8.4–10.5)
Chloride: 109 meq/L (ref 96–112)
Creatinine, Ser: 0.73 mg/dL (ref 0.40–1.20)
GFR: 89.33 mL/min (ref >60.00)
Glucose, Bld: 104 mg/dL — ABNORMAL HIGH (ref 70–99)
Potassium: 3.8 meq/L (ref 3.5–5.1)
Sodium: 136 meq/L (ref 135–145)
Total Bilirubin: 0.7 mg/dL (ref 0.2–1.2)
Total Protein: 6.4 g/dL (ref 6.0–8.3)

## 2024-03-03 LAB — VITAMIN D 25 HYDROXY (VIT D DEFICIENCY, FRACTURES): VITD: 29.25 ng/mL — ABNORMAL LOW (ref 30.00–100.00)

## 2024-03-03 NOTE — Assessment & Plan Note (Signed)
 Repeat vitamin level pending.

## 2024-03-03 NOTE — Assessment & Plan Note (Signed)
 Repeat lipid panel pending.

## 2024-03-03 NOTE — Progress Notes (Signed)
 Subjective:    Patient ID: Angela Harmon, female    DOB: 1966-05-04, 58 y.o.   MRN: [id number]  Angela Harmon is a very pleasant 58 y.o. female who presents today for complete physical and follow up of chronic conditions.  Immunizations: -Tetanus: Completed in 2020 -Influenza: Influenza vaccine provided today.  -Shingles: Completed Shingrix  series  Diet: Fair diet.  Exercise: No regular exercise.  Eye exam: Completes annually  Dental exam: Completes semi-annually    Pap Smear: Completed in 2024 Mammogram: Completed this year at GYN office   Colonoscopy: Completed in 2017, due 2027  BP Readings from Last 3 Encounters:  03/03/24 (!) 144/82  02/28/23 (!) 142/86  10/25/22 (!) 150/89   When she goes to her other doctors and her BP is always 120s-130s systolic. She does not check her BP at home. She denies blurred vision, chest pain, dizziness.   Review of Systems  Constitutional:  Negative for unexpected weight change.  HENT:  Negative for rhinorrhea.   Respiratory:  Negative for cough and shortness of breath.   Cardiovascular:  Negative for chest pain.  Gastrointestinal:  Negative for constipation and diarrhea.  Genitourinary:  Negative for difficulty urinating.  Musculoskeletal:  Negative for arthralgias and myalgias.  Skin:  Negative for rash.  Allergic/Immunologic: Negative for environmental allergies.  Neurological:  Negative for dizziness, numbness and headaches.  Psychiatric/Behavioral:  The patient is not nervous/anxious.          Past Medical History:  Diagnosis Date   Bladder spasm    Chickenpox    History of UTI    Iron deficiency anemia    Iron deficiency anemia 01/14/2019    Social History   Socioeconomic History   Marital status: Divorced    Spouse name: Not on file   Number of children: Not on file   Years of education: Not on file   Highest education level: Not on file  Occupational History   Not on file  Tobacco Use   Smoking status:  Never   Smokeless tobacco: Never  Vaping Use   Vaping status: Never Used  Substance and Sexual Activity   Alcohol use: Not Currently   Drug use: Never   Sexual activity: Not on file  Other Topics Concern   Not on file  Social History Narrative   Divorced.   2 children.   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   Enjoys reading, going to the movies, travel, riding bikes.   Social Drivers of Corporate investment banker Strain: Not on file  Food Insecurity: Not on file  Transportation Needs: Not on file  Physical Activity: Not on file  Stress: Not on file  Social Connections: Not on file  Intimate Partner Violence: Not on file    Past Surgical History:  Procedure Laterality Date   CESAREAN SECTION  2001    Family History  Problem Relation Age of Onset   Diabetes Mother    Hypertension Mother    Dementia Mother    Hypertension Father    Alcohol abuse Father    Lymphoma Maternal Grandmother    Diabetes Maternal Grandfather    Rectal cancer Maternal Aunt     No Known Allergies  No current outpatient medications on file prior to visit.   No current facility-administered medications on file prior to visit.    BP (!) 144/82   Pulse 79   Temp 97.7 F (36.5 C) (Oral)   Ht 5' 3.25 (1.607 m)  Wt 158 lb 6 oz (71.8 kg)   LMP 11/27/2018 (Within Days)   SpO2 99%   BMI 27.83 kg/m  Objective:   Physical Exam HENT:     Right Ear: Tympanic membrane and ear canal normal.     Left Ear: Tympanic membrane and ear canal normal.  Eyes:     Pupils: Pupils are equal, round, and reactive to light.  Cardiovascular:     Rate and Rhythm: Normal rate and regular rhythm.  Pulmonary:     Effort: Pulmonary effort is normal.     Breath sounds: Normal breath sounds.  Abdominal:     General: Bowel sounds are normal.     Palpations: Abdomen is soft.     Tenderness: There is no abdominal tenderness.  Musculoskeletal:        General: Normal range of motion.     Cervical back:  Neck supple.  Skin:    General: Skin is warm and dry.  Neurological:     Mental Status: She is alert and oriented to person, place, and time.     Cranial Nerves: No cranial nerve deficit.     Deep Tendon Reflexes:     Reflex Scores:      Patellar reflexes are 2+ on the right side and 2+ on the left side. Psychiatric:        Mood and Affect: Mood normal.     Physical Exam        Assessment & Plan:  Preventative health care Assessment & Plan: Immunizations UTD. Influenza vaccine provided today.  Pap smear UTD. Follows with GYN Mammogram UTD, will obtain from GYN office Colonoscopy UTD, due 2027  Discussed the importance of a healthy diet and regular exercise in order for weight loss, and to reduce the risk of further co-morbidity.  Exam stable. Labs pending.  Follow up in 1 year for repeat physical.   Orders: -     Comprehensive metabolic panel with GFR -     Lipid panel -     CBC  Need for influenza vaccination -     Flu vaccine trivalent PF, 6mos and older(Flulaval,Afluria,Fluarix,Fluzone)  Hyperlipidemia, unspecified hyperlipidemia type Assessment & Plan: Repeat lipid panel pending.  Orders: -     Comprehensive metabolic panel with GFR -     Lipid panel -     CBC  Elevated blood pressure reading Assessment & Plan: Noted again on exam today, improved but still above goal on recheck. We discussed her history of documented elevated blood pressure readings over the last year.  Interestingly, her blood pressure is normal in other offices.  Will obtain her GYN records.  Will have her monitor her blood pressure at home daily for the next 2 weeks.  She will update via MyChart at that time.   Vitamin D  deficiency Assessment & Plan: Repeat vitamin level pending.  Orders: -     VITAMIN D  25 Hydroxy (Vit-D Deficiency, Fractures)    Assessment and Plan Assessment & Plan         Comer MARLA Gaskins, NP   History of Present Illness

## 2024-03-03 NOTE — Assessment & Plan Note (Addendum)
 Noted again on exam today, improved but still above goal on recheck. We discussed her history of documented elevated blood pressure readings over the last year.  Interestingly, her blood pressure is normal in other offices.  Will obtain her GYN records.  Will have her monitor her blood pressure at home daily for the next 2 weeks.  She will update via MyChart at that time.

## 2024-03-03 NOTE — Assessment & Plan Note (Signed)
 Immunizations UTD. Influenza vaccine provided today.  Pap smear UTD. Follows with GYN Mammogram UTD, will obtain from GYN office Colonoscopy UTD, due 2027  Discussed the importance of a healthy diet and regular exercise in order for weight loss, and to reduce the risk of further co-morbidity.  Exam stable. Labs pending.  Follow up in 1 year for repeat physical.

## 2024-03-03 NOTE — Patient Instructions (Signed)
 Stop by the lab prior to leaving today. I will notify you of your results once received.   Start monitoring your blood pressure daily, around the same time of day, for the next 2-3 weeks.  Ensure that you have rested for 30 minutes prior to checking your blood pressure.   Record your readings and update via MyChart in 2 weeks.   It was a pleasure to see you today!

## 2024-03-06 ENCOUNTER — Ambulatory Visit: Payer: Self-pay | Admitting: Primary Care
